# Patient Record
Sex: Male | Born: 1960 | Race: White | Hispanic: No | Marital: Married | Smoking: Never smoker
Health system: Southern US, Community
[De-identification: ages and names within clinical notes are randomized; demographics above are authoritative.]

## PROBLEM LIST (undated history)

## (undated) DIAGNOSIS — I10 Essential (primary) hypertension: Secondary | ICD-10-CM

## (undated) DIAGNOSIS — K219 Gastro-esophageal reflux disease without esophagitis: Secondary | ICD-10-CM

## (undated) DIAGNOSIS — I8390 Asymptomatic varicose veins of unspecified lower extremity: Secondary | ICD-10-CM

## (undated) DIAGNOSIS — K5730 Diverticulosis of large intestine without perforation or abscess without bleeding: Secondary | ICD-10-CM

## (undated) HISTORY — PX: CHOLECYSTECTOMY: SHX55

## (undated) HISTORY — DX: Diverticulosis of large intestine without perforation or abscess without bleeding: K57.30

## (undated) HISTORY — DX: Essential (primary) hypertension: I10

## (undated) HISTORY — PX: COLONOSCOPY: SHX174

## (undated) HISTORY — PX: BASAL CELL CARCINOMA EXCISION: SHX1214

## (undated) HISTORY — DX: Asymptomatic varicose veins of unspecified lower extremity: I83.90

## (undated) HISTORY — PX: VARICOSE VEIN SURGERY: SHX832

## (undated) HISTORY — PX: PILONIDAL CYST EXCISION: SHX744

---

## 2011-01-19 ENCOUNTER — Encounter (INDEPENDENT_AMBULATORY_CARE_PROVIDER_SITE_OTHER): Payer: Self-pay | Admitting: *Deleted

## 2011-01-21 ENCOUNTER — Encounter (INDEPENDENT_AMBULATORY_CARE_PROVIDER_SITE_OTHER): Payer: Self-pay | Admitting: *Deleted

## 2011-01-23 ENCOUNTER — Encounter: Payer: Self-pay | Admitting: Gastroenterology

## 2011-01-29 NOTE — Letter (Signed)
Summary: Moviprep Instructions  [facility]  [address]   [place], [place] [address]   Phone: [phone]  Fax: [phone]       Blake Johnson    11-Jan-1975    MRN: [id number]        Procedure Day Dorna Bloom: Friday, 02-06-11     Arrival Time: 9:00 a.m.     Procedure Time: 10:00 a.m.     Location of Procedure:                    x   [facility] (4th Floor)   PREPARATION FOR COLONOSCOPY WITH MOVIPREP   Starting 5 days prior to your procedure 02-01-11 do not eat nuts, seeds, popcorn, corn, beans, peas,  salads, or any raw vegetables.  Do not take any fiber supplements (e.g. Metamucil, Citrucel, and Benefiber).  THE DAY BEFORE YOUR PROCEDURE         DATE: 02-05-11  DAY: Thursday  1.  Drink clear liquids the entire day-NO SOLID FOOD  2.  Do not drink anything colored red or purple.  Avoid juices with pulp.  No orange juice.  3.  Drink at least 64 oz. (8 glasses) of fluid/clear liquids during the day to prevent dehydration and help the prep work efficiently.  CLEAR LIQUIDS INCLUDE: Water Jello Ice Popsicles Tea (sugar ok, no milk/cream) Powdered fruit flavored drinks Coffee (sugar ok, no milk/cream) Gatorade Juice: apple, white grape, white cranberry  Lemonade Clear bullion, consomm, broth Carbonated beverages (any kind) Strained chicken noodle soup Hard Candy                             4.  In the morning, mix first dose of MoviPrep solution:    Empty 1 Pouch A and 1 Pouch B into the disposable container    Add lukewarm drinking water to the top line of the container. Mix to dissolve    Refrigerate (mixed solution should be used within 24 hrs)  5.  Begin drinking the prep at 5:00 p.m. The MoviPrep container is divided by 4 marks.   Every 15 minutes drink the solution down to the next mark (approximately 8 oz) until the full liter is complete.   6.  Follow completed prep with 16 oz of clear liquid of your choice (Nothing red or purple).   Continue to drink clear liquids until bedtime.  7.  Before going to bed, mix second dose of MoviPrep solution:    Empty 1 Pouch A and 1 Pouch B into the disposable container    Add lukewarm drinking water to the top line of the container. Mix to dissolve    Refrigerate  THE DAY OF YOUR PROCEDURE      DATE: 02-06-11  DAY: Friday  Beginning at 5:00 a.m. (5 hours before procedure):         1. Every 15 minutes, drink the solution down to the next mark (approx 8 oz) until the full liter is complete.  2. Follow completed prep with 16 oz. of clear liquid of your choice.    3. You may drink clear liquids until 8:00 a.m. (2 HOURS BEFORE PROCEDURE).   MEDICATION INSTRUCTIONS  Unless otherwise instructed, you should take regular prescription medications with a small sip of water   as early as possible the morning of your procedure.           OTHER INSTRUCTIONS  You will need a responsible  adult at least 51 years of age to accompany you and drive you home.   This person must remain in the waiting room during your procedure.  Wear loose fitting clothing that is easily removed.  Leave jewelry and other valuables at home.  However, you may wish to bring a book to read or  an iPod/MP3 player to listen to music as you wait for your procedure to start.  Remove all body piercing jewelry and leave at home.  Total time from sign-in until discharge is approximately 2-3 hours.  You should go home directly after your procedure and rest.  You can resume normal activities the  day after your procedure.  The day of your procedure you should not:   Drive   Make legal decisions   Operate machinery   Drink alcohol   Return to work  You will receive specific instructions about eating, activities and medications before you leave.    The above instructions have been reviewed and explained to me by   Ezra Sites RN  January 23, 2011 8:19 AM     I fully understand and can verbalize  these instructions _____________________________ Date _________

## 2011-01-29 NOTE — Letter (Signed)
Summary: Pre Visit Letter Revised  [facility]  [address] [place], [place] [address]   Phone: [phone]  Fax: [phone]        01/19/2011 MRN: [id number] [facility] [address] [place], [place]  [address]             Procedure Date:  02/06/2011 @ 10:00   Direct colon-Dr. Russella Dar   Welcome to the Gastroenterology Division at [facility].    You are scheduled to see a nurse for your pre-procedure visit on 01/23/2011 at 8:00am on the 3rd floor at [facility], [address]  We ask that you try to arrive at our office 15 minutes prior to your appointment time to allow for check-in.  Please take a minute to review the attached form.  If you answer "Yes" to one or more of the questions on the first page, we ask that you call the person listed at your earliest opportunity.  If you answer "No" to all of the questions, please complete the rest of the form and bring it to your appointment.    Your nurse visit will consist of discussing your medical and surgical history, your immediate family medical history, and your medications.   If you are unable to list all of your medications on the form, please bring the medication bottles to your appointment and we will list them.  We will need to be aware of both prescribed and over the counter drugs.  We will need to know exact dosage information as well.    Please be prepared to read and sign documents such as consent forms, a financial agreement, and acknowledgement forms.  If necessary, and with your consent, a friend or relative is welcome to sit-in on the nurse visit with you.  Please bring your insurance card so that we may make a copy of it.  If your insurance requires a referral to see a specialist, please bring your referral form from your primary care physician.  No co-pay is required for this nurse visit.     If you cannot keep your appointment, please call [phone] to cancel or reschedule prior to your  appointment date.  This allows [place] the opportunity to schedule an appointment for another patient in need of care.    Thank you for choosing [facility] for your medical needs.  We appreciate the opportunity to care for you.  Please visit [place] at our website  to learn more about our practice.  Sincerely, The Gastroenterology Division

## 2011-01-29 NOTE — Miscellaneous (Signed)
Summary: [facility] PV  Clinical Lists Changes  Medications: Added new medication of MOVIPREP 100 GM  SOLR (PEG-KCL-NACL-NASULF-NA ASC-C) As per prep instructions. - Signed Rx of MOVIPREP 100 GM  SOLR (PEG-KCL-NACL-NASULF-NA ASC-C) As per prep instructions.;  #1 x 0;  Signed;  Entered by: Ezra Sites RN;  Authorized by: Meryl Dare MD [facility];  Method used: Electron[facility] to [id number] [address] [place], [place]  [address], Ph: [phone] or [phone], Fax: [phone] Observations: Added new observation of NKA: T (01/23/2011 7:57)    Prescriptions: MOVIPREP 100 GM  SOLR (PEG-KCL-NACL-NASULF-NA ASC-C) As per prep instructions.  #1 x 0   Entered by:   Ezra Sites RN   Authorized by:   Meryl Dare MD [facility]   Signed by:   Ezra Sites RN on 01/23/2011   Method used:   [facility]ronically to        [facility] [phone]* (retail)       [address]       [place], [place]  [address]       Ph: [phone] or [phone]       Fax: [phone]   RxID:   [id number]

## 2011-02-06 ENCOUNTER — Other Ambulatory Visit: Payer: Self-pay | Admitting: Gastroenterology

## 2011-02-06 ENCOUNTER — Other Ambulatory Visit (AMBULATORY_SURGERY_CENTER): Payer: 59 | Admitting: Gastroenterology

## 2011-02-06 DIAGNOSIS — K5730 Diverticulosis of large intestine without perforation or abscess without bleeding: Secondary | ICD-10-CM

## 2011-02-06 DIAGNOSIS — Z1211 Encounter for screening for malignant neoplasm of colon: Secondary | ICD-10-CM

## 2011-02-06 DIAGNOSIS — Z800 Family history of malignant neoplasm of digestive organs: Secondary | ICD-10-CM

## 2011-02-06 DIAGNOSIS — D126 Benign neoplasm of colon, unspecified: Secondary | ICD-10-CM

## 2011-02-06 DIAGNOSIS — D1330 Benign neoplasm of unspecified part of small intestine: Secondary | ICD-10-CM

## 2011-02-10 NOTE — Procedures (Addendum)
Summary: Colonoscopy  Patient: Blake Johnson Note: All result statuses are Final unless otherwise noted.  Tests: (1) Colonoscopy (COL)   COL Colonoscopy           DONE     [facility]     [address]     [place], [place]  [address]           COLONOSCOPY PROCEDURE REPORT     PATIENT:  [name], [name]  MR#:  [id number]     BIRTHDATE:  05-15-1961, 49 yrs. old  GENDER:  male     ENDOSCOPIST:  Judie Petit T. Russella Dar, MD, [facility]     Referred by:  Stacie Glaze, M.D.     PROCEDURE DATE:  02/06/2011     PROCEDURE:  Colonoscopy with biopsy and snare polypectomy     ASA CLASS:  Class II     INDICATIONS:  1) Routine Risk Screening: father with colon cancer     at 82.     MEDICATIONS:   Fentanyl 125 mcg IV, Versed 13 mg IV     DESCRIPTION OF PROCEDURE:   After the risks benefits and     alternatives of the procedure were thoroughly explained, informed     consent was obtained.  Digital rectal exam was performed and     revealed no abnormalities.   The LB [id number]-[id number] endoscope     was introduced through the anus and advanced to the cecum, which     was identified by both the appendix and ileocecal valve, without     limitations.  The quality of the prep was excellent, using     MoviPrep.  The instrument was then slowly withdrawn as the colon     was fully examined.     <<PROCEDUREIMAGES>>     FINDINGS:  A sessile polyp was found in the cecum. It was 4 mm in     size. The polyp was removed using cold biopsy forceps.  Two polyps     were found at the hepatic flexure. They were 4 - 5 mm in size.     Polyps were snared without cautery. Retrieval was successful. Mild     diverticulosis was found in the sigmoid colon. Otherwise normal     colonoscopy without other polyps, masses, vascular ectasias, or     inflammatory changes. Retroflexed views in the rectum revealed     internal hemorrhoids, small.  The time to cecum =  2.75  minutes.     The scope was then withdrawn (time =  13.33   min) from the patient     and the procedure completed.           COMPLICATIONS:  None           ENDOSCOPIC IMPRESSION:     1) 4 mm sessile polyp in the cecum     2) 4 - 5 mm Two polyps at the hepatic flexure     3) Mild diverticulosis in the sigmoid colon     4) Internal hemorrhoids           RECOMMENDATIONS:     1) Await pathology results     2) High fiber diet with liberal fluid intake.     3) Repeat Colonscopy in 3 year if all polyps are adenomatous,     colonoscopy in 5 years if 1 or 2 are adenomatous otherwise routine     risk quidelines with colonoscopy in 10 years.Marland Kitchen  Venita Lick. Russella Dar, MD, [facility]           n.     eSIGNED:   Venita Lick. Athira Janowicz at 02/06/2011 10:21 AM           Blake Johnson, [id number]  Note: An exclamation mark (!) indicates a result that was not dispersed into the flowsheet. Document Creation Date: 02/06/2011 10:21 AM _______________________________________________________________________  (1) Order result status: Final Collection or observation date-time: 02/06/2011 10:12 Requested date-time:  Receipt date-time:  Reported date-time:  Referring Physician:   Ordering Physician: Claudette Head [phone]) Specimen Source:  Source: Launa Grill Order Number: [id number] Lab site:   Appended Document: Colonoscopy     Procedures Next Due Date:    Colonoscopy: 01/2016

## 2011-02-11 ENCOUNTER — Encounter: Payer: Self-pay | Admitting: Gastroenterology

## 2011-02-19 NOTE — Letter (Signed)
Summary: Patient Notice- Polyp Results  [facility]  [address] [place], [place] [address]   Phone: [phone]  Fax: [phone]        February 11, 2011 MRN: [id number]    [facility] [address] [place], [place]  [address]    Dear Blake Johnson,  I am pleased to inform you that the colon polyp(s) removed during your recent colonoscopy was (were) found to be benign (no cancer detected) upon pathologic examination.  I recommend you have a repeat colonoscopy examination in 5 years to look for recurrent polyps, as having colon polyps increases your risk for having recurrent polyps or even colon cancer in the future.  Should you develop new or worsening symptoms of abdominal pain, bowel habit changes or bleeding from the rectum or bowels, please schedule an evaluation with either your primary care physician or with me.  Continue treatment plan as outlined the day of your exam.  Please call us if you are having persistent problems or have questions about your condition that have not been fully answered at this time.  Sincerely,  Meryl Dare MD [facility]  This letter has been electronically signed by your physician.  Appended Document: Patient Notice- Polyp Results letter mailed

## 2011-04-10 ENCOUNTER — Observation Stay (HOSPITAL_COMMUNITY)
Admission: EM | Admit: 2011-04-10 | Discharge: 2011-04-10 | Disposition: A | Discharge status: Home / Self Care | Payer: 59 | Attending: Emergency Medicine | Admitting: Emergency Medicine

## 2011-04-10 ENCOUNTER — Observation Stay (HOSPITAL_COMMUNITY): Payer: 59

## 2011-04-10 ENCOUNTER — Emergency Department (HOSPITAL_COMMUNITY): Payer: 59

## 2011-04-10 ENCOUNTER — Encounter (HOSPITAL_COMMUNITY): Payer: Self-pay | Admitting: Radiology

## 2011-04-10 DIAGNOSIS — R079 Chest pain, unspecified: Principal | ICD-10-CM | POA: Insufficient documentation

## 2011-04-10 DIAGNOSIS — R072 Precordial pain: Secondary | ICD-10-CM

## 2011-04-10 DIAGNOSIS — R000 Tachycardia, unspecified: Secondary | ICD-10-CM | POA: Insufficient documentation

## 2011-04-10 HISTORY — DX: Gastro-esophageal reflux disease without esophagitis: K21.9

## 2011-04-10 LAB — TROPONIN I
Troponin I: 0.01 ng/mL (ref 0.00–0.06)
Troponin I: 0.01 ng/mL (ref 0.00–0.06)

## 2011-04-10 LAB — CK TOTAL AND CKMB (NOT AT ARMC)
Relative Index: 1.5 (ref 0.0–2.5)
Relative Index: 1.2 (ref 0.0–2.5)
Total CK: 117 U/L (ref 7–232)
Total CK: 124 U/L (ref 7–232)

## 2011-04-10 LAB — POCT I-STAT, CHEM 8
BUN: 21 mg/dL (ref 6–23)
Calcium, Ion: 1.17 mmol/L (ref 1.12–1.32)
Chloride: 106 mEq/L (ref 96–112)
Glucose, Bld: 108 mg/dL — ABNORMAL HIGH (ref 70–99)
HCT: 40 % (ref 39.0–52.0)

## 2011-04-10 LAB — POCT CARDIAC MARKERS: Troponin i, poc: <0.05 ng/mL (ref 0.00–0.09)

## 2011-04-10 LAB — HEPATIC FUNCTION PANEL
ALT: 20 U/L (ref 0–53)
AST: 20 U/L (ref 0–37)
Bilirubin, Direct: 0.1 mg/dL (ref 0.0–0.3)
Indirect Bilirubin: 0.4 mg/dL (ref 0.3–0.9)
Total Bilirubin: 0.5 mg/dL (ref 0.3–1.2)

## 2011-04-10 MED ORDER — IOHEXOL 350 MG/ML SOLN
100.0000 mL | Freq: Once | INTRAVENOUS | Status: AC | PRN
  Administered 2011-04-10: 100 mL via INTRAVENOUS

## 2011-04-23 ENCOUNTER — Encounter: Payer: Self-pay | Admitting: *Deleted

## 2011-04-23 ENCOUNTER — Encounter: Payer: Self-pay | Admitting: Cardiology

## 2011-04-24 ENCOUNTER — Ambulatory Visit (INDEPENDENT_AMBULATORY_CARE_PROVIDER_SITE_OTHER): Payer: 59 | Admitting: Cardiology

## 2011-04-24 DIAGNOSIS — K219 Gastro-esophageal reflux disease without esophagitis: Secondary | ICD-10-CM

## 2011-04-24 DIAGNOSIS — R079 Chest pain, unspecified: Secondary | ICD-10-CM

## 2011-04-24 DIAGNOSIS — I8390 Asymptomatic varicose veins of unspecified lower extremity: Secondary | ICD-10-CM

## 2011-04-24 NOTE — Patient Instructions (Signed)
Start Apsirin 81mg  daily--this should be enteric coated.  Schedule an appointment with GI for evaluation of GERD/ ?ulcer.  Schedule an appointment with Dr Myra Gianotti for evaluation and management of varicose veins.  You do not need to schedule a follow-up appointment with Dr Shirlee Latch.

## 2011-04-26 ENCOUNTER — Encounter: Payer: Self-pay | Admitting: Cardiology

## 2011-04-26 DIAGNOSIS — I8390 Asymptomatic varicose veins of unspecified lower extremity: Secondary | ICD-10-CM | POA: Insufficient documentation

## 2011-04-26 NOTE — Assessment & Plan Note (Signed)
Atypical chest pain with reassuring stress echo and negative PE CT.  No further chest pain now that he is taking omeprazole daily. He does have significant GERD.  I will have him followup with GI.  He should take ASA 81 mg daily (male > 50 probably benefits).

## 2011-04-26 NOTE — Progress Notes (Signed)
50 yo with history of GERD presents for ER followup of chest pain.  In 4/12, patient felt a pressure/achiness across his chest that lingered all of one day and into the next.  The week prior to this, he had woken up with similar chest pain that resolved in 10-20 minutes.  No exertional chest pain.  After the prolonged episode, he went to the ER.  ECG was unremarkable and cardiac enzymes were negative. D dimer was elevated, so he had PE CT.  This showed no PE or dissection.  He had a stress echo that showed no exercise-induced wall motion abnormalities.  Since coming home from the ER, he has had no further chest pain.  He does remember that he had pushed his lawn tractor for a fairly long distance when it ran out of gas prior to this event and he wonders if the pain could be musculoskeletal.  Patient also has fairly significant GERD.  He has frequent heartburn if he does not take omeprazole, and thinks the pain also could have been a severe version of his prior GERD.  He has been taking omprazole daily since his ER visit.   ECG: NSR, normal  Labs (4/12): D dimer elevated, cardiac enzymes negative, K 4.3, creatinine 1.1  PMH: 1. Varicose veins 2. GERD 3. Atypical chest pain: Stress echo (4/12) with nonspecific upsloping ST depression, exercise to stage IV Bruce protocol, no exercise-induced wall motion abnormalities.   SH: Nonsmoker.  Art gallery manager at [employer].  Married.   SH: Father with colon cancer.  No family history of CAD.   ROS: All systems reviewed and negative except as per HPI.   Current Outpatient Prescriptions  Medication Sig Dispense Refill  . omeprazole (PRILOSEC OTC) 20 MG tablet Take 20 mg by mouth daily.        Marland Kitchen aspirin EC 81 MG tablet Take 1 tablet (81 mg total) by mouth daily.        BP 122/78  Pulse 69  Ht 6\' 2"  (1.88 m)  Wt 263 lb (119.296 kg)  BMI 33.77 kg/m2 General: NAD, overweight Neck: No JVD, no thyromegaly or thyroid nodule.  Lungs: Clear to auscultation  bilaterally with normal respiratory effort. CV: Nondisplaced PMI.  Heart regular S1/S2, no S3/S4, no murmur.  No peripheral edema.  No carotid bruit.  Normal pedal pulses. Significant venous varicosities right lower leg.  Abdomen: Soft, nontender, no hepatosplenomegaly, no distention.  Skin: Intact without lesions or rashes.  Neurologic: Alert and oriented x 3.  Psych: Normal affect. Extremities: No clubbing or cyanosis.  HEENT: Normal.

## 2011-04-26 NOTE — Assessment & Plan Note (Signed)
Very significant venous varicosities in the right leg.  They are somewhat painful.  He wants a referral to a vascular surgeon, which I will provide.

## 2011-04-28 ENCOUNTER — Telehealth: Payer: Self-pay | Admitting: Cardiology

## 2011-04-28 NOTE — Telephone Encounter (Signed)
I reviewed with Dr Shirlee Latch. OK to schedule pt with Dr Hart Rochester or Dr Arbie Cookey.

## 2011-04-28 NOTE — Telephone Encounter (Signed)
I will forward to Dr McLean for review 

## 2011-04-28 NOTE — Telephone Encounter (Signed)
Per  Juliette Alcide, a faxed referral was sent over on yesterday - for pt to see Dr. Myra Gianotti. He particularly doesn't see patients for varicose  vein. Dr. Arbie Cookey & Dr. Hart Rochester are the only physician that see patient for that diagnosed. Will this be O.K.

## 2011-04-29 NOTE — Telephone Encounter (Signed)
I talked with Blake Johnson. She is aware that it is OK to schedule pt with Dr Arbie Cookey or Dr Hart Rochester.

## 2011-05-27 ENCOUNTER — Encounter (INDEPENDENT_AMBULATORY_CARE_PROVIDER_SITE_OTHER): Payer: 59 | Admitting: Vascular Surgery

## 2011-05-27 ENCOUNTER — Encounter (INDEPENDENT_AMBULATORY_CARE_PROVIDER_SITE_OTHER): Payer: 59

## 2011-05-27 DIAGNOSIS — I83893 Varicose veins of bilateral lower extremities with other complications: Secondary | ICD-10-CM

## 2011-05-27 DIAGNOSIS — M79609 Pain in unspecified limb: Secondary | ICD-10-CM

## 2011-05-27 NOTE — Consult Note (Signed)
NEW PATIENT CONSULTATION  Blake Johnson, Blake Johnson DOB:  1961-05-23                                       05/27/2011 EAVWU#:[id number]  Patient presents today for evaluation of right leg venous pathology.  He is a very active, healthy 50 year old gentleman with a 15-year history of progressive varicosities in his right leg.  These have become much more prominent over his medial thigh down onto his calf.  He is having increasingly severe changes of venous stasis disease and venous hypertension with darkening and thickening of the skin around his medial ankle.  He does not have any history of DVT.  PAST MEDICAL HISTORY:  Significant for gastroesophageal reflux disease.  SOCIAL HISTORY:  He does not smoke.  FAMILY HISTORY:  His father with colon cancer.  No history of coronary artery disease in the family.  REVIEW OF SYSTEMS:  Negative except for history of present illness.  PHYSICAL EXAMINATION:  A well-developed and well-nourished white male appearing his stated age in no acute distress.  Blood pressure 126/76, pulse 88, respirations 20.  HEENT:  Normal.  His radial and dorsalis pedis pulses are 2+ bilaterally.  Musculoskeletal shows no major deformities or cyanosis.  Neurologic:  No focal weakness or paresthesias.  Skin without ulcers or rashes.  He does have marked varicosities throughout his medial right leg and matting of a telangiectasia around his foot and thickening of the skin.  He underwent a formal venous duplex in our office, and this shows reflux throughout his great saphenous vein on the right thigh.  He does have some incompetence in his deep system as well.  I discussed this at length with patient due to his extensive changes of venous hypertension.  I have recommend that we will have him fitted with graduated compression stockings, which we have done today, thigh-high, 20 mmHg.  We will see him again in 3 months for continued discussion. With  this severe level of venous hypertension I feel that he may need further treatment with saphenous vein ablation and stab phlebectomy for improvement of his venous hypertension.  We will discuss with him in 3 months.    Larina Earthly, M.D. Electronically Signed  TFE/MEDQ  D:  05/27/2011  T:  05/27/2011  Job:  [id number]  cc:   Marca Ancona, MD

## 2011-06-01 NOTE — Procedures (Unsigned)
LOWER EXTREMITY VENOUS REFLUX EXAM  INDICATION:  Varicose veins and right lower extremity pain.  EXAM:  Using color-flow imaging and pulse Doppler spectral analysis, the right common femoral, femoral, popliteal, posterior tibial, great and small saphenous veins were evaluated.  There is evidence suggesting deep venous insufficiency in the right lower extremity.  The right saphenofemoral junction is not competent with reflux of >585milliseconds. The right GSV is not competent with reflux of >522milliseconds with the caliber as described below.  The right proximal small saphenous vein demonstrates competency.  GSV Diameter (used if found to be incompetent only)                                           Right    Left Proximal Greater Saphenous Vein           0.99 cm  cm Proximal-to-mid-thigh                     0.42 cm  cm Mid thigh                                 0.48 cm  cm Mid-distal thigh                          cm       cm Distal thigh                              0.83 cm  cm Knee                                      0.74 cm  cm  IMPRESSION: 1. The right greater saphenous vein ix not competent with reflux of     >532milliseconds. 2. The right great saphenous vein is not tortuous. 3. The deep venous system of the right lower extremity is not     competent with reflux of >500 milliseconds. 4. The right small saphenous vein is competent. 5. The right mid/distal calf perforator is incompetent with reflux of     >500 milliseconds and measures approximately 0.35 cm.     ___________________________________________ Larina Earthly, M.D.  SH/MEDQ  D:  05/27/2011  T:  05/27/2011  Job:  [id number]

## 2011-06-16 ENCOUNTER — Telehealth: Payer: Self-pay | Admitting: Gastroenterology

## 2011-06-16 NOTE — Telephone Encounter (Signed)
Patient has had several incidents over a few months of chest pain.  He has been cleared by Cardiology.  He is asking to be seen prior to August.  Prilosec qd is not helping.  I have reviewed with him an antireflux diet and asked him to increase his Prilosec to BID.  He is given an appt with Mike Gip PA for 06/19/11 11:00

## 2011-06-19 ENCOUNTER — Encounter: Payer: Self-pay | Admitting: Physician Assistant

## 2011-06-19 ENCOUNTER — Telehealth: Payer: Self-pay | Admitting: *Deleted

## 2011-06-19 ENCOUNTER — Other Ambulatory Visit (INDEPENDENT_AMBULATORY_CARE_PROVIDER_SITE_OTHER): Payer: 59

## 2011-06-19 ENCOUNTER — Ambulatory Visit (INDEPENDENT_AMBULATORY_CARE_PROVIDER_SITE_OTHER): Payer: 59 | Admitting: Physician Assistant

## 2011-06-19 VITALS — BP 118/84 | HR 88 | Ht 74.0 in | Wt 261.0 lb

## 2011-06-19 DIAGNOSIS — K219 Gastro-esophageal reflux disease without esophagitis: Secondary | ICD-10-CM

## 2011-06-19 DIAGNOSIS — R143 Flatulence: Secondary | ICD-10-CM

## 2011-06-19 DIAGNOSIS — R1013 Epigastric pain: Secondary | ICD-10-CM

## 2011-06-19 DIAGNOSIS — R079 Chest pain, unspecified: Secondary | ICD-10-CM

## 2011-06-19 DIAGNOSIS — R141 Gas pain: Secondary | ICD-10-CM

## 2011-06-19 LAB — HEPATIC FUNCTION PANEL
ALT: 20 U/L (ref 0–53)
Bilirubin, Direct: 0.1 mg/dL (ref 0.0–0.3)
Total Bilirubin: 0.3 mg/dL (ref 0.3–1.2)

## 2011-06-19 LAB — CBC WITH DIFFERENTIAL/PLATELET
Basophils Absolute: 0.1 10*3/uL (ref 0.0–0.1)
Eosinophils Absolute: 0.2 10*3/uL (ref 0.0–0.7)
HCT: 38.2 % — ABNORMAL LOW (ref 39.0–52.0)
Lymphs Abs: 2.1 10*3/uL (ref 0.7–4.0)
MCHC: 34.6 g/dL (ref 30.0–36.0)
MCV: 86.2 fl (ref 78.0–100.0)
Monocytes Absolute: 0.6 10*3/uL (ref 0.1–1.0)
Neutrophils Relative %: 70.9 % (ref 43.0–77.0)
Platelets: 308 10*3/uL (ref 150.0–400.0)
RDW: 13.3 % (ref 11.5–14.6)
WBC: 10.3 10*3/uL (ref 4.5–10.5)

## 2011-06-19 NOTE — Patient Instructions (Signed)
Please go to the basement level to have your labs drawn.  Take the Prilosec twice daily. Stay on a bland , low fat diet for now.  We have scheduled the Abdominal Ultrasound at [facility]. Date and directions provided.

## 2011-06-19 NOTE — Telephone Encounter (Signed)
Message copied by Daphine Deutscher on Fri Jun 19, 2011  4:36 PM ------      Message from: Mike Gip S      Created: Fri Jun 19, 2011  4:34 PM       Please let Blake Johnson know his labs are normal. He is scheduled for an ultrasound

## 2011-06-19 NOTE — Progress Notes (Signed)
Agree with initial assessment and plans 

## 2011-06-19 NOTE — Telephone Encounter (Signed)
Left a message for patient to call me. 

## 2011-06-19 NOTE — Progress Notes (Signed)
Subjective:    Patient ID: Blake Johnson, male    DOB: December 04, 1961, 50 y.o.   MRN: [id number]  HPI Blake Johnson is a pleasant 50 year old white male known to Dr. Shana Chute underwent colonoscopy in February 2012 with finding of one polyp which was a tubular adenoma and diverticulosis. Patient has been self treating for acid reflux over the past one year with Prilosec. He says he takes it about every other day generally.  At this time he had onset about a month and a half ago of intermittent episodes of upper abdominal bloating chest discomfort and pain between his shoulder blades. He says these episodes have awakened him at night and may last for up to 3 days and then gradually resolved. He has not had any associated fever or chills no nausea or vomiting no change in his bowel habits no melena or hematochezia. He had an episode in April which took him to the emergency room and has since undergone a cardiac workup with Dr. Shirlee Latch. He has had an unremarkable EKG negative cardiac enzymes. D. dimer was elevated so he had to CT scan of the chest which was negative for PE. He has also had a stress echo which was normal. Patient reports 3 distinct episodes since that time the last one over this past weekend. He says is very uncomfortable that he generally doesn't eat as much during these episodes. He does feel he has some ongoing acid reflux has no dysphagia or odynophagia and no typical heartburn-type symptoms. He uses an occasional law ibuprofen but none on regular basis.  Patient has not had prior upper endoscopy.  Labs done in April 2012 with CBC and hepatic panel unremarkable. At this time he is taking Prilosec twice daily just over the past few days.    Review of Systems  Constitutional: Negative.   HENT: Negative.   Eyes: Negative.   Respiratory: Negative.   Cardiovascular: Positive for chest pain.  Gastrointestinal: Positive for abdominal pain and abdominal distention.  Genitourinary: Negative.     Musculoskeletal: Negative.   Skin: Negative.   Neurological: Negative.   Hematological: Negative.   Psychiatric/Behavioral: Negative.       Outpatient Prescriptions Prior to Visit  Medication Sig Dispense Refill  . omeprazole (PRILOSEC OTC) 20 MG tablet Take 20 mg by mouth daily.        Marland Kitchen aspirin EC 81 MG tablet Take 1 tablet (81 mg total) by mouth daily.         Objective:   Physical Exam Well-developed white male in no acute distress, pleasant, alert and oriented x3 HEENT;  normocephalic EOMI PERRLA sclera anicteric  Neck; supple no JVD  Cardiovascular; regular rate and rhythm with S1-S2  Pulmonary; clear bilaterally Abdomen; soft basically nontender nondistended bowel sounds active no mass or hepatosplenomegaly  Rectal; not done Skin; warm and dry, no jaundice, no edema  Psych; mood and affect normal and appropriate        Assessment & Plan:  #5 50 year old male with episodic chest pain, upper abdominal bloating and upper back pain radiating between his shoulder blades. This is in the setting of recent negative cardiac workup, and chronic GERD. Am not convinced that his current symptoms are secondary to acid reflux, need to rule out biliary colic.  Plan; Continue Prilosec 20 mg by mouth twice daily Bland low-fat diet. CBC and hepatic panel today Schedule for upper abdominal ultrasound, if this is negative, will need upper endoscopy with Dr. Russella Dar. Patient is also asked to call  should he have another episode of the labs can be obtained while he is symptomatic  #2 Diverticulosis  #3 Adenomatous colon polyps Last colonoscopy February 2012.

## 2011-06-22 NOTE — Telephone Encounter (Signed)
Patient notified of lab results as per Amy Esterwood, PA 

## 2011-06-25 ENCOUNTER — Ambulatory Visit (HOSPITAL_COMMUNITY)
Admission: RE | Admit: 2011-06-25 | Discharge: 2011-06-25 | Disposition: A | Discharge status: Home / Self Care | Payer: 59 | Source: Ambulatory Visit | Attending: Physician Assistant | Admitting: Physician Assistant

## 2011-06-25 ENCOUNTER — Telehealth: Payer: Self-pay

## 2011-06-25 DIAGNOSIS — R142 Eructation: Secondary | ICD-10-CM | POA: Insufficient documentation

## 2011-06-25 DIAGNOSIS — R143 Flatulence: Secondary | ICD-10-CM | POA: Insufficient documentation

## 2011-06-25 DIAGNOSIS — R1013 Epigastric pain: Secondary | ICD-10-CM | POA: Insufficient documentation

## 2011-06-25 DIAGNOSIS — R079 Chest pain, unspecified: Secondary | ICD-10-CM | POA: Insufficient documentation

## 2011-06-25 DIAGNOSIS — R1011 Right upper quadrant pain: Secondary | ICD-10-CM

## 2011-06-25 DIAGNOSIS — R141 Gas pain: Secondary | ICD-10-CM

## 2011-06-25 DIAGNOSIS — K8020 Calculus of gallbladder without cholecystitis without obstruction: Secondary | ICD-10-CM

## 2011-06-25 NOTE — Telephone Encounter (Signed)
Message copied by Annett Fabian on Thu Jun 25, 2011  3:35 PM ------      Message from: [place], [place] S      Created: Thu Jun 25, 2011  2:41 PM       Please call pt and let him know the US shows multiple gallstones which are probably causing his sxs, see how he is feeling. I will review with Dr. Russella Dar- but  He needs surgical consult, please go ahead and set him up , thanks

## 2011-06-26 ENCOUNTER — Telehealth: Payer: Self-pay | Admitting: *Deleted

## 2011-06-26 DIAGNOSIS — K8020 Calculus of gallbladder without cholecystitis without obstruction: Secondary | ICD-10-CM

## 2011-06-26 DIAGNOSIS — R935 Abnormal findings on diagnostic imaging of other abdominal regions, including retroperitoneum: Secondary | ICD-10-CM

## 2011-06-26 NOTE — Telephone Encounter (Signed)
Called pt and LM , [facility] made him an appt with Dr. Consuello Bossier on 7-24-112 at 1:30 PM.  He is to arrive at 1:00 PM.  I also left the phone number for [facility] and their address.  I advised he needs to call them if he cannot make that appointment to reschedule.

## 2011-06-26 NOTE — Telephone Encounter (Signed)
Patient was contacted by [facility].  Referral scheduled for 07/07/11 1:30

## 2011-06-26 NOTE — Telephone Encounter (Signed)
Message copied by Derry Skill on Fri Jun 26, 2011  8:30 AM ------      Message from: Claudette Head T      Created: Thu Jun 25, 2011  5:44 PM       Please schedule surgical consult for cholelithiaisis

## 2011-06-26 NOTE — Telephone Encounter (Signed)
I called the pt @ [phone] and advised him of the ultrasound results with the gallstones.  I advised him that Dr Russella Dar and Mike Gip PA reviewed the [place] results and he has gallstones, more than likely the reason for his pain.  I LM we will contact [facility] and make an appointment for him to have a consultation with a surgeon.  I also advised him [facility] has access to his records .  I left my name and number for him to call me.

## 2011-07-07 ENCOUNTER — Ambulatory Visit (INDEPENDENT_AMBULATORY_CARE_PROVIDER_SITE_OTHER): Payer: 59 | Admitting: General Surgery

## 2011-07-07 ENCOUNTER — Encounter (INDEPENDENT_AMBULATORY_CARE_PROVIDER_SITE_OTHER): Payer: Self-pay | Admitting: General Surgery

## 2011-07-07 VITALS — BP 120/78 | HR 80 | Temp 97.0°F | Ht 74.0 in | Wt 258.0 lb

## 2011-07-07 DIAGNOSIS — K8020 Calculus of gallbladder without cholecystitis without obstruction: Secondary | ICD-10-CM

## 2011-07-07 NOTE — Progress Notes (Signed)
Subjective:     Patient ID: Blake Johnson, male   DOB: 1961-03-12, 50 y.o.   MRN: [id number]  HPI  Review of SystemsROS No chronic medical problems no chronic illnesses. No allergies. 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 Patient is married and has 2 children    Objective:   Physical ExamVital signs are reviewed and are normal his lungs were clear cardiac normal sinus rhythm slightly increased weight no evidence of any abdominal tenderness on exam at this time no umbilical or inguinal hernias noted I did not do her colon or rectal exam disease had a recent colonoscopy by Dr. Russella Dar denies any chronic back problems.    Assessment:    Patient's ultrasound and history was reviewed also reviewed little gallbladder booklet with him and he admitted that his worst episode of pain started approximately 3 AM after he had a heavy meal late that evening and night before in the ER I do not think his f his were abnormal and on the ultrasound we don't see a dilated common bile    Plan:    We will plan on doing the appendectomy cholecystectomy and cholangiogram at [facility] in the next few weeks think he's got a trip scheduled to [place] thank you was to postpone this and have his gallbladder surgery prior to the trip he will be off work only a few days hopefully I would not plan on going out of the country for approximately a week after surgery.  I did give him a prescription for Vicodin if he would have an episode of pain he can take a pain tablet otherwise he'll use the tablets for postoperative pain management

## 2011-07-07 NOTE — Patient Instructions (Signed)
To talk with schedule as about arranging for your surgery. Follow a small incision low-fat low bulk diet and not evening going to bed until he is her gallbladder removed. We will plan on allowing you to be released on the day of surgery that if indeed we can keep her overnight in the following morning. We will plan on doing a cholangiogram at the time of  cholecystectomy

## 2011-07-14 ENCOUNTER — Telehealth: Payer: Self-pay | Admitting: Cardiology

## 2011-07-14 NOTE — Telephone Encounter (Signed)
LOV,12 Lead faxed to [facility]  @ [phone]  07/14/11/km

## 2011-07-16 ENCOUNTER — Other Ambulatory Visit (INDEPENDENT_AMBULATORY_CARE_PROVIDER_SITE_OTHER): Payer: Self-pay | Admitting: General Surgery

## 2011-07-16 ENCOUNTER — Encounter (HOSPITAL_COMMUNITY): Payer: 59

## 2011-07-16 LAB — DIFFERENTIAL
Eosinophils Relative: 2 % (ref 0–5)
Lymphocytes Relative: 22 % (ref 12–46)
Lymphs Abs: 2.1 10*3/uL (ref 0.7–4.0)
Monocytes Relative: 6 % (ref 3–12)

## 2011-07-16 LAB — COMPREHENSIVE METABOLIC PANEL
AST: 13 U/L (ref 0–37)
Albumin: 3.7 g/dL (ref 3.5–5.2)
Alkaline Phosphatase: 112 U/L (ref 39–117)
BUN: 12 mg/dL (ref 6–23)
CO2: 28 mEq/L (ref 19–32)
Chloride: 103 mEq/L (ref 96–112)
GFR calc non Af Amer: >60 mL/min (ref >60)
Potassium: 3.5 mEq/L (ref 3.5–5.1)
Total Bilirubin: 0.3 mg/dL (ref 0.3–1.2)

## 2011-07-16 LAB — CBC
HCT: 40.7 % (ref 39.0–52.0)
Hemoglobin: 13 g/dL (ref 13.0–17.0)
MCH: 27.9 pg (ref 26.0–34.0)
MCV: 87.3 fL (ref 78.0–100.0)
Platelets: 278 10*3/uL (ref 150–400)
RBC: 4.66 MIL/uL (ref 4.22–5.81)
WBC: 9.6 10*3/uL (ref 4.0–10.5)

## 2011-07-16 LAB — SURGICAL PCR SCREEN: MRSA, PCR: NEGATIVE

## 2011-07-22 ENCOUNTER — Other Ambulatory Visit (INDEPENDENT_AMBULATORY_CARE_PROVIDER_SITE_OTHER): Payer: Self-pay | Admitting: General Surgery

## 2011-07-22 ENCOUNTER — Ambulatory Visit (HOSPITAL_COMMUNITY)
Admission: RE | Admit: 2011-07-22 | Discharge: 2011-07-22 | Disposition: A | Discharge status: Home / Self Care | Payer: 59 | Source: Ambulatory Visit | Attending: General Surgery | Admitting: General Surgery

## 2011-07-22 ENCOUNTER — Ambulatory Visit (HOSPITAL_COMMUNITY): Payer: 59

## 2011-07-22 DIAGNOSIS — K8000 Calculus of gallbladder with acute cholecystitis without obstruction: Secondary | ICD-10-CM | POA: Insufficient documentation

## 2011-07-22 DIAGNOSIS — K8010 Calculus of gallbladder with chronic cholecystitis without obstruction: Secondary | ICD-10-CM

## 2011-07-22 DIAGNOSIS — E663 Overweight: Secondary | ICD-10-CM | POA: Insufficient documentation

## 2011-07-22 DIAGNOSIS — Z01812 Encounter for preprocedural laboratory examination: Secondary | ICD-10-CM | POA: Insufficient documentation

## 2011-07-22 DIAGNOSIS — Z6833 Body mass index (BMI) 33.0-33.9, adult: Secondary | ICD-10-CM | POA: Insufficient documentation

## 2011-07-29 NOTE — Op Note (Signed)
NAME:  Blake Johnson, Blake Johnson NO.:  [id number]  MEDICAL RECORD NO.:  [id number]  LOCATION:  DAYL                         FACILITY:  [facility]  PHYSICIAN:  Anselm Pancoast. Taneesha Edgin, M.D.DATE OF BIRTH:  Aug 31, 1961  DATE OF PROCEDURE:  07/22/2011 DATE OF DISCHARGE:  07/22/2011                              OPERATIVE REPORT   PREOPERATIVE DIAGNOSIS:  Chronic cholecystitis with stones.  POSTOPERATIVE DIAGNOSIS:  Acute cholecystitis with stones.  OPERATIONS:  Laparoscopic cholecystectomy with cholangiogram.  ANESTHESIA:  General anesthesia.  SURGEON:  Anselm Pancoast. Zachery Dakins, M.D.  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  An ultrasound was performed and that shows chronic cholecystitis within the gallbladder, largest about a 1.5 in size and no gallbladder wall thickening or pericystic fluid on the ultrasound that was done on June 25, 2011.  The patient travels to [place] frequently and I saw him in the office on July 07, 2011, and at that time he was not having acute pain.  He had a trip scheduled to [place] coming up soon, but he elected to postpone that and wanted to proceed on with removing his gallbladder.  The patient has a colonoscopy with Dr. Russella Dar within the last several years and when I saw him in the office he was not acutely tender.  He says he has not had further episodes of acute pain in the 10 days since I saw him in the office for 2 weeks and is here for this planned procedure.  His lab studies shows that his white count was 9600.  His hematocrit is 40.  His liver function studies are normal.  Glucose was 122.  The patient preoperatively was given a dose of antibiotics.  He has PAS stockings and after the permits have been signed, etc., was  induced with general anesthesia and endotracheal tube placed by Dr. __________ and then the abdomen was prepped with Betadine solution and draped in sterile manner.  I made a little small vertical incision below the umbilicus.  The patient is about 230 pounds and the fascia was identified, picked up between two Kochers and then a small opening carefully made through the fascia and then the underlying peritoneum.  Pursestring suture of 0 Vicryl was placed and Hasson cannula introduced.  The gallbladder was visualized but the omentum was adherent, acute and chronic inflammation and the gallbladder was so tense that you could not grasp it and after the trocars had been placed a 10 mm subxiphoid and two lateral 5 mm trocars.  The fascia had been anesthetized for trocar placement.  I aspirated the gallbladder with __________ aspirator and I had peeled off the omentum from it.  Then you could grasp the gallbladder and retracted outward lateral and the adhesions were fortunately were kind of more acute so they could be kind of teased away and it was not necessary to use a 30 degree camera.  He was rotated to the left and his head was elevated and then the proximal portion of the  gallbladder there was a big stone impacted in the proximal portion of the gallbladder and we carefully teased this freeing up the gallbladder.  I could see the cystic duct lymph node and the stone that was packed in the neck of the gallbladder prevented [place] from being able to do anything very rapidly but then we could kind of milk the stone back up into the fundus of the gallbladder.  With this, you could then see what looked like a very short cystic duct and whether we would talk about the common hepatic duct or what we were not positive to be sure, you could see the cystic artery and I doubly clipped it, but did not divide it and then I put a clip across what I thought was the most distal portion of the gallbladder and  cystic duct and made a little opening.  The mucosa of the cystic bile duct was identified.  This was opened and a Cook catheter introduced.  The cholangiogram shows that the gallbladder attaches to the right hepatic duct and this was about a centimeter duct and I did the cholangiogram.  The left and right hepatic ducts was visualized but then we obliqued the x-ray and did a second one to be sure that was what we were seeing and it confirmed our original suspicion.  The catheter was removed.  I could get two clips on the cystic duct and I do not think I have compromised the little right hepatic branch and then after this was clipped and divided, I then inspected the cystic artery again and put another clip on the gallbladder and divided it and then freed this inflamed gallbladder from the liver bed.  Good hemostasis was obtained.  The gallbladder after it was completely freed was placed in an EndoCatch bag.  At the little area where I had aspirated the gallbladder at the end, there was a little bit of bile, it was white bile spillage and we placed a gallbladder in the EndoCatch bag and we washed everything thoroughly.  The gallbladder bed was inspected.  Hemostasis was good.  I used __________ to kind of cauterized a few little questionable areas but nothing of any significant bleeding that I could see.  I then switched the 10 mm camera to the upper 10 mm trocar, grabbed the bag containing the gallbladder. I did open the gallbladder, pulling the neck up in the bag, and then got the big stone out so I could bring it out through the fascia incision without enlarging it.  The fascia was then closed with an additional figure-of-8 of 0 Vicryl in addition to the pursestring tied on both and anesthetized the fascia and then I put one single stitch in the middle because we could still get the tip of a hemostat in and he is a large individual.  The reinspection, we had looked at the clips, the  artery and everything looked satisfactory and irrigating fluid aspirated.  Then we removed the 5 mm port and released CO2 and then withdrew the upper 10 mm trocar.  The subcutaneous wounds were closed with 4-0 Vicryl. Benzoin and Steri-Strips on the skin.  Sponge and needle counts were correct x 2.     Anselm Pancoast. Zachery Dakins, M.D.     WJW/MEDQ  D:  07/22/2011  T:  07/23/2011  Job:  [id number]  cc:   Venita Lick. Russella Dar, MD, FACG [address] [place] [address]  Electronically Signed by Consuello Bossier M.D. on 07/29/2011 09:21:53  AM

## 2011-07-30 ENCOUNTER — Encounter: Payer: Self-pay | Admitting: *Deleted

## 2011-08-20 ENCOUNTER — Encounter: Payer: Self-pay | Admitting: Vascular Surgery

## 2011-08-26 ENCOUNTER — Ambulatory Visit: Payer: 59 | Admitting: Vascular Surgery

## 2011-09-02 ENCOUNTER — Encounter (INDEPENDENT_AMBULATORY_CARE_PROVIDER_SITE_OTHER): Payer: Self-pay | Admitting: General Surgery

## 2011-09-02 ENCOUNTER — Ambulatory Visit (INDEPENDENT_AMBULATORY_CARE_PROVIDER_SITE_OTHER): Payer: 59 | Admitting: General Surgery

## 2011-09-02 VITALS — BP 136/94 | HR 60 | Temp 98.6°F | Resp 16 | Ht 74.0 in | Wt 216.4 lb

## 2011-09-02 DIAGNOSIS — K8010 Calculus of gallbladder with chronic cholecystitis without obstruction: Secondary | ICD-10-CM

## 2011-09-02 NOTE — Patient Instructions (Signed)
See [place] on a p.r.n. basis I would not recommend he is going to with a history of esophageal reflux

## 2011-09-02 NOTE — Progress Notes (Addendum)
Subjective:     Patient ID: Blake Johnson, male   DOB: Oct 19, 1961, 50 y.o.   MRN: [id number]  HPIPatient returns he is an approximate 6 weeks followup [place] cholecystectomy cholangiogram and is doing fine his incision is healed nicely she's had a history of reflux esophagitis but he says is really not needed to Prilosec he's been on for years as the episodes of fever follow esophageal reflux were probably related to a little gallbladder problem he is back to work no restrictions in his incisions look good   Review of Systems     Objective:   Physical ExamSatisfactory postoperative course of larroscopic cholecystectomy and cholangiogram stones in his gallbladder on path exam. He has decrease symptoms that contributed to his GERD following his cholecystectomy, he can taper down on his antacids and will see Dr. Russella Dar OR  or [place] p.r.n. basis.    Assessment:         Plan:

## 2011-10-19 ENCOUNTER — Encounter: Payer: Self-pay | Admitting: Vascular Surgery

## 2011-10-20 ENCOUNTER — Encounter: Payer: Self-pay | Admitting: Vascular Surgery

## 2011-10-20 ENCOUNTER — Ambulatory Visit (INDEPENDENT_AMBULATORY_CARE_PROVIDER_SITE_OTHER): Payer: 59 | Admitting: Vascular Surgery

## 2011-10-20 VITALS — BP 135/89 | HR 88 | Resp 16 | Ht 74.0 in | Wt 264.5 lb

## 2011-10-20 DIAGNOSIS — I83893 Varicose veins of bilateral lower extremities with other complications: Secondary | ICD-10-CM

## 2011-10-20 NOTE — Progress Notes (Signed)
Problems with Activities of Daily Living Secondary to Leg Pain  1. Mr. Orrego states leg pain makes plane trips (his job requires frequent international travel) and car trips very difficult for him.   2. Mr. Laurel states that leg pain makes yard work very difficult for him.  3. Mr. Axtman states that leg pain makes walking on treadmill for exercise very difficult for him.   Rankin, Neena Rhymes    Failure of  Conservative Therapy:  1. Worn 20-30 mm Hg thigh high compression hose >3 months with no relief of symptoms.  2. Frequently elevates legs-no relief of symptoms  3. Taken Ibuprofen 600 Mg TID with no relief of symptoms.  The patient continues to have temperature in his right calf and thigh despite graduated compression garments. He underwent repeat imaging with SonoSite by myself. This shows enlargement and reflux throughout his great saphenous vein. He has exit of his main saphenous vein in the distal thigh and a large branch that runs under the skin and then reenters his main saphenous vein in the proximal thigh. He may require 2 separate laser treatments of the areas involved. I have recommended laser ablation and stab phlebectomy for relief of his venous hypertension. He does have extensive hemosiderin deposits in his medial ankle from his venous hypertension.

## 2011-11-03 ENCOUNTER — Other Ambulatory Visit: Payer: Self-pay | Admitting: *Deleted

## 2011-11-03 DIAGNOSIS — I83893 Varicose veins of bilateral lower extremities with other complications: Secondary | ICD-10-CM

## 2011-11-18 ENCOUNTER — Encounter: Payer: Self-pay | Admitting: Vascular Surgery

## 2011-11-18 ENCOUNTER — Other Ambulatory Visit: Payer: 59 | Admitting: Vascular Surgery

## 2011-11-19 ENCOUNTER — Ambulatory Visit (INDEPENDENT_AMBULATORY_CARE_PROVIDER_SITE_OTHER): Payer: 59 | Admitting: Vascular Surgery

## 2011-11-19 ENCOUNTER — Encounter: Payer: Self-pay | Admitting: Vascular Surgery

## 2011-11-19 VITALS — BP 130/83 | HR 92 | Resp 20 | Ht 74.0 in | Wt 261.0 lb

## 2011-11-19 DIAGNOSIS — I83893 Varicose veins of bilateral lower extremities with other complications: Secondary | ICD-10-CM

## 2011-11-19 NOTE — Progress Notes (Signed)
Laser Ablation Procedure      Date: 11/19/2011    Raijon Lindfors DOB:[birth year]  Consent signed: Yes  Surgeon:T.F. Jawana Reagor  Procedure: Laser Ablation: right Greater Saphenous Vein  BP 130/83  Pulse 92  Resp 20  Ht 6\' 2"  (1.88 m)  Wt 261 lb (118.389 kg)  BMI 33.51 kg/m2  Start time: 3 :05 PM   End time: 4 :40PM  Tumescent Anesthesia: 650 cc 0.9% NaCl with 50 cc Lidocaine HCL with 1% Epi and 15 cc 8.4% NaHCO3  Local Anesthesia: 4 cc Lidocaine HCL and NaHCO3 (ratio 2:1)  Continuous Mode: 15 Watts Total Energy 2378  Joules Total Time2 :38     Stab Phlebectomy: 10-20 Sites: Thigh and Calf  Patient tolerated procedure well: Yes  Rankin, Neena Rhymes  Description of Procedure:  After marking the course of the saphenous vein and the secondary varicosities in the standing position, the patient was placed on the operating table in the supine position, and the right leg was prepped and draped in sterile fashion. Local anesthetic was administered, and under ultrasound guidance the saphenous vein was accessed with a micro needle and guide wire; then the micro puncture sheath was placed. A guide wire was inserted to the saphenofemoral junction, followed by a 5 french sheath.  The position of the sheath and then the laser fiber below the junction was confirmed using the ultrasound and visualization of the aiming beam.  Tumescent anesthesia was administered along the course of the saphenous vein using ultrasound guidance. Protective laser glasses were placed on the patient, and the laser was fired at at 15 watt continuous mode.  For a total of 2378 joules.  A steri strip was applied to the puncture site.  The patient was then put into Trendelenburg position.  Local anesthetic was utilized overlying the marked varicosities.  Greater than 10-20 stab wounds were made using the tip of an 11 blade; and using the vein hook,  The phlebectomies were performed using a hemostat to avulse these  varicosities.  Adequate hemostasis was achieved, and steri strips were applied to the stab wound.      ABD pads and thigh high compression stockings were applied.  Ace wrap bandages were applied over the phlebectomy sites and at the top of the saphenofemoral junction.  Blood loss was less than 15 cc.  The patient ambulated out of the operating room having tolerated the procedure well.  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

## 2011-11-20 ENCOUNTER — Encounter: Payer: Self-pay | Admitting: Vascular Surgery

## 2011-11-24 ENCOUNTER — Telehealth: Payer: Self-pay | Admitting: *Deleted

## 2011-11-24 NOTE — Telephone Encounter (Signed)
11/24/2011  Time: 8:54 AM   Patient Name: Blake Johnson  Patient of: T.F. Early  Procedure:Laser Ablation right    Stab phlebectomy 10-20 right leg  Left a message at patient's jhome checking  His status  Yes    Comments/Actions Taken: Requested [name] call [facility] if experiencing problems or if he has questions.      @SIGNATURE @Keondrick Dilks , Neena Rhymes

## 2011-11-25 ENCOUNTER — Encounter: Payer: Self-pay | Admitting: Vascular Surgery

## 2011-11-26 ENCOUNTER — Encounter: Payer: Self-pay | Admitting: Vascular Surgery

## 2011-11-26 ENCOUNTER — Ambulatory Visit (INDEPENDENT_AMBULATORY_CARE_PROVIDER_SITE_OTHER): Payer: 59 | Admitting: Vascular Surgery

## 2011-11-26 VITALS — BP 115/79 | HR 86 | Resp 20 | Ht 74.0 in | Wt 262.0 lb

## 2011-11-26 DIAGNOSIS — I83893 Varicose veins of bilateral lower extremities with other complications: Secondary | ICD-10-CM

## 2011-11-26 NOTE — Progress Notes (Signed)
Patient presents today for followup of his laser ablation of right great saphenous vein one week ago. He had phlebectomy of extensive varicosities in his medial thigh and calf as well. He has done well with the usual amount of bruising and swelling. He has been compliant with his compression garment. His incisions are all healing nicely.  Venous duplex today reveals a closure of the saphenous vein from the entry site to just below the saphenofemoral junction no evidence of DVT.  He will continue to wear his compression garment and we will see for additional week and we will see him again on an as-needed basis

## 2011-11-26 NOTE — Progress Notes (Signed)
Rle venous duplex f/u post ablation from 11/19/2011 performed @ [facility]

## 2011-12-10 NOTE — Procedures (Unsigned)
DUPLEX DEEP VENOUS EXAM - LOWER EXTREMITY  INDICATION:  Varicose veins, pain and swelling of the right lower extremity.  HISTORY:  Edema:  Yes. Trauma/Surgery:  Right endovenous laser ablation on 11/19/2011. Pain:  Yes. PE:  No. Previous DVT:  No. Anticoagulants:  No. Other:  DUPLEX EXAM:               CFV   SFV   PopV  PTV    GSV               R  L  R  L  R  L  R   L  R  L Thrombosis    o  o  o     o     o      + Spontaneous   +  +  +     +     +      0 Phasic        +  +  +     +     +      0 Augmentation  +  +  +     +     +      0 Compressible  +  +  +     +     +      0 Competent     +  +  +     0     +  Legend:  + - yes  o - no  p - partial  D - decreased  IMPRESSION: 1. No evidence of deep venous thrombosis identified in the right lower     extremity. 2. Good post-ablation results the length of the right greater     saphenous vein ablated. 3. Contralateral common femoral vein is patent and compressible.   _____________________________ Larina Earthly, M.D.  SH/MEDQ  D:  11/26/2011  T:  11/26/2011  Job:  [id number]

## 2012-12-30 IMAGING — US US ABDOMEN COMPLETE
1 series · 13 of 25 positions shown · non-contrast
Comparison: None

CLINICAL DATA: Epigastric pain.  Bloating.  Chest pain.

ABDOMINAL ULTRASOUND COMPLETE

[Series 1: us abdomen complete · 0.35mm/px · 13 of 76 slices shown]
[im 1/76]
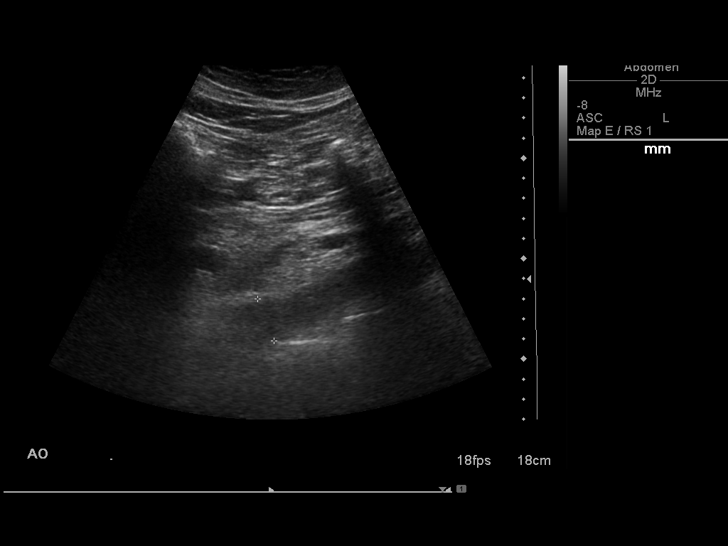
[im 7/76]
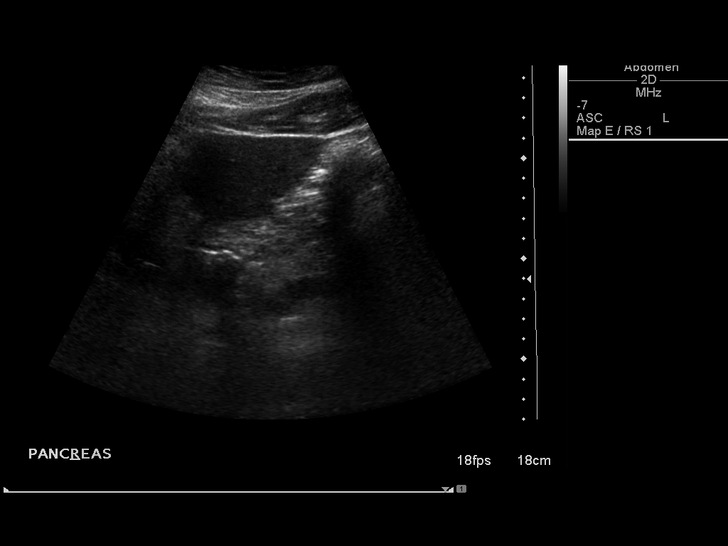
[im 13/76]
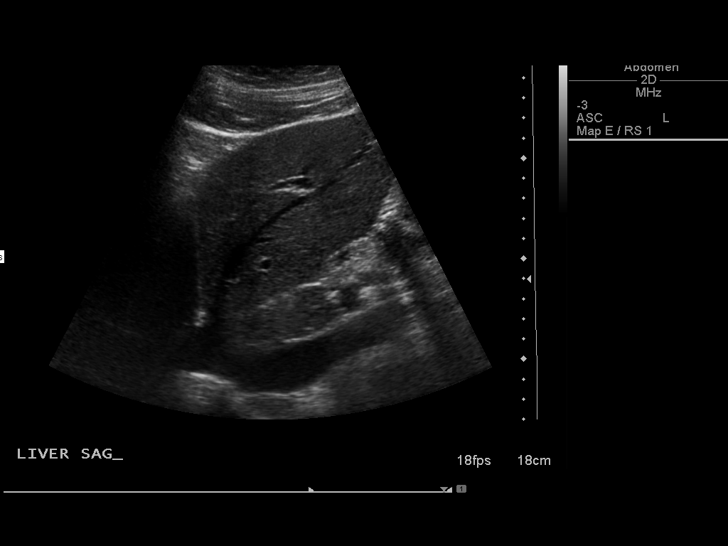
[im 19/76]
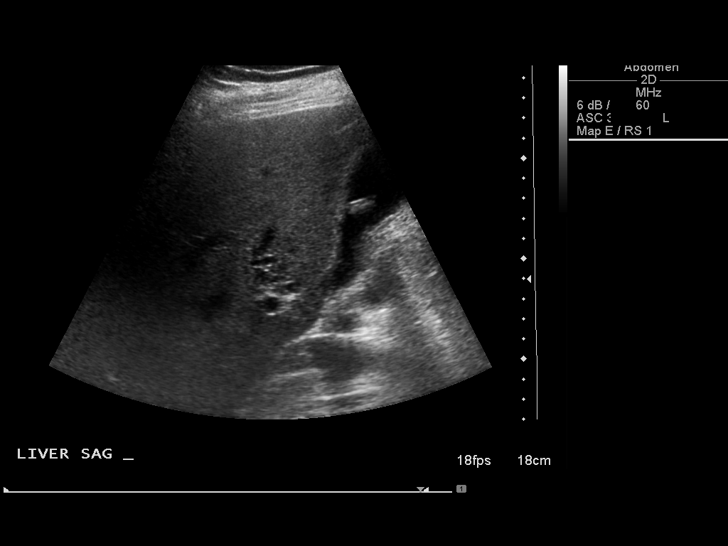
[im 26/76]
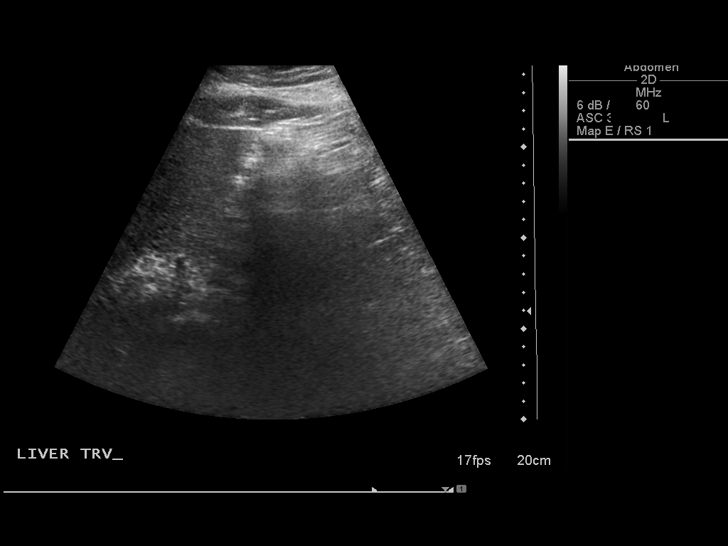
[im 32/76]
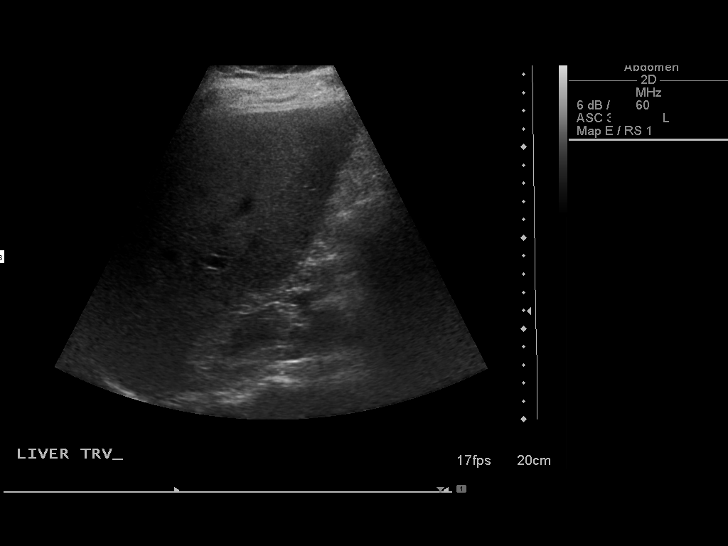
[im 38/76]
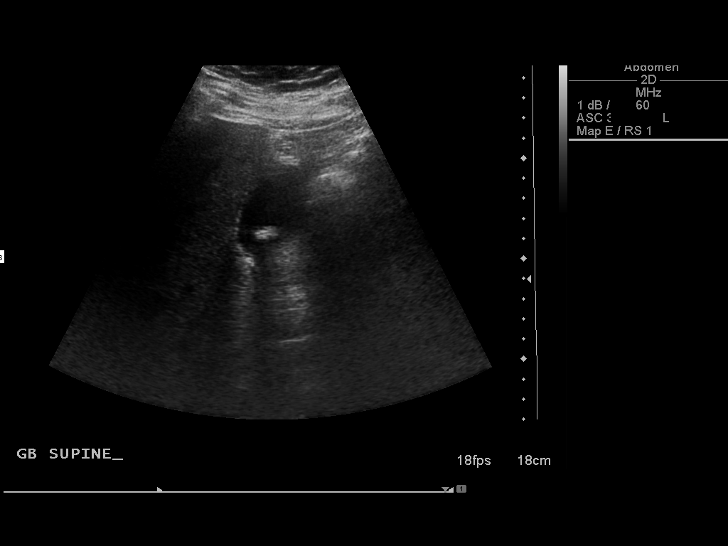
[im 44/76]
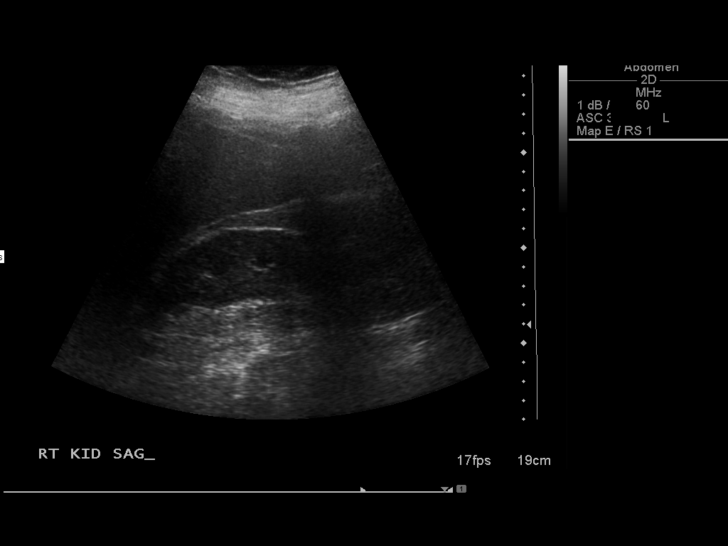
[im 51/76]
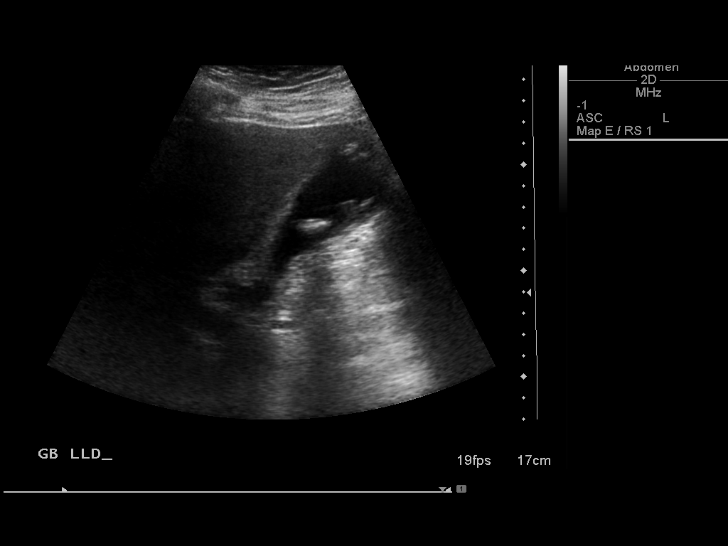
[im 57/76]
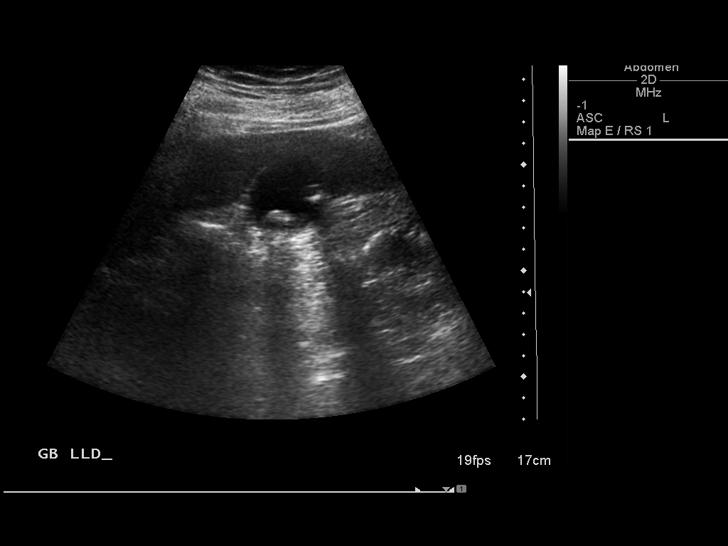
[im 63/76]
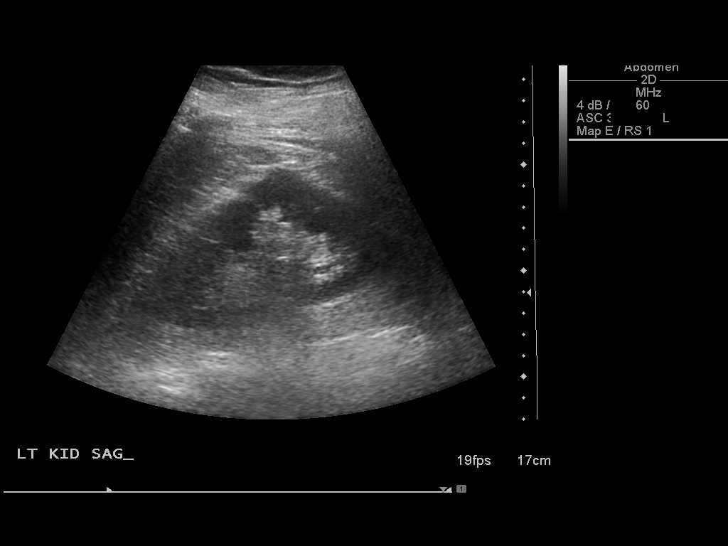
[im 69/76]
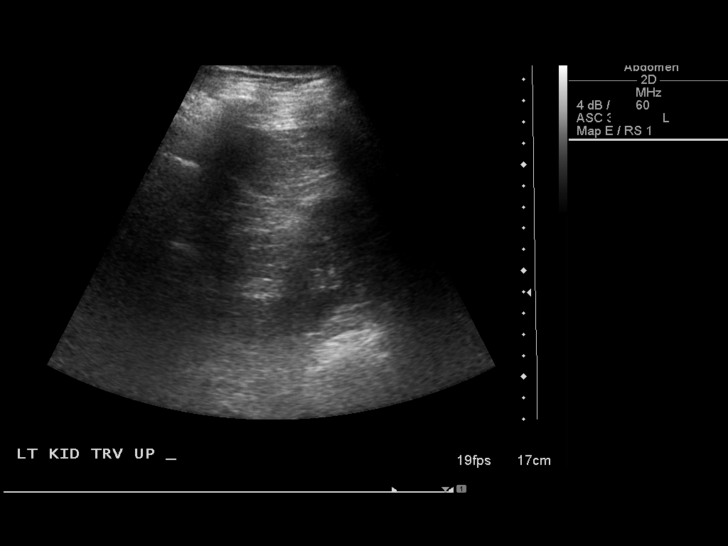
[im 76/76]
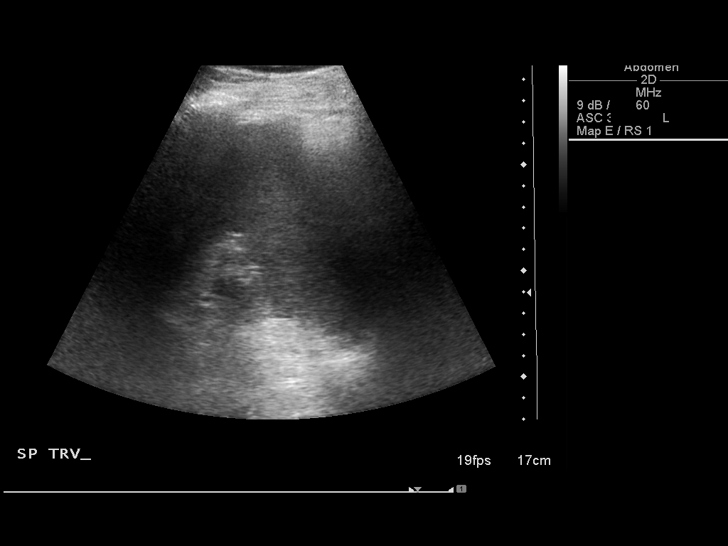

[13 of 25 positions shown; findings below may reference images not displayed]

FINDINGS: Gallbladder: There is cholelithiasis.  Multiple gallstones are seen
within the gallbladder.  The largest has a greatest diameter of
cm. The gallstones appear mobile. No gallbladder wall thickening
or pericholecystic fluid. The gallbladder wall thickness measured
2.7 mm. No sonographic Murphy's sign according to the ultrasound
technologist.

CBD: Normal in caliber measuring 5 mm. No choledocholithiasis is
evident.

Liver:  Normal size and echotexture without focal parenchymal
abnormality. Hepatic veins appear patent.  Portal vein is patent.
There is hepatopetal flow within the portal vein by color Doppler
imaging.

IVC:  Patent throughout its visualized course in the abdomen.

Pancreas:  Although the pancreas is difficult to visualize in its
entirety, no focal pancreatic abnormality is identified.  Portions
of body and tail are obscured by overlying bowel gas.

Spleen:  Normal size and echotexture without focal abnormality.
Length is 8 cm.

Right kidney:  No hydronephrosis.  Well-preserved cortex.  Normal
parenchymal echotexture without focal abnormalities.  Right renal
length is 12.3 cm.

Left kidney:  No hydronephrosis.  Well-preserved cortex.  Normal
parenchymal echotexture without focal abnormalities.  Left renal
length is 12.4 cm. There is dromedary hump configuration of the
left kidney.

Aorta:  Maximum diameter is or 2.2 cm.  No aneurysm is evident.

Ascites:  None.
IMPRESSION: Cholelithiasis.No ultrasonic evidence of acute
cholecystitis.

## 2015-12-31 ENCOUNTER — Other Ambulatory Visit: Payer: Self-pay | Admitting: Internal Medicine

## 2015-12-31 ENCOUNTER — Encounter: Payer: Self-pay | Admitting: Internal Medicine

## 2015-12-31 ENCOUNTER — Ambulatory Visit (INDEPENDENT_AMBULATORY_CARE_PROVIDER_SITE_OTHER): Payer: Commercial Managed Care - HMO | Admitting: Internal Medicine

## 2015-12-31 VITALS — BP 134/88 | HR 76 | Temp 97.3°F | Resp 16 | Ht 75.25 in | Wt 272.4 lb

## 2015-12-31 DIAGNOSIS — Z23 Encounter for immunization: Secondary | ICD-10-CM

## 2015-12-31 DIAGNOSIS — Z125 Encounter for screening for malignant neoplasm of prostate: Secondary | ICD-10-CM

## 2015-12-31 DIAGNOSIS — Z0000 Encounter for general adult medical examination without abnormal findings: Secondary | ICD-10-CM

## 2015-12-31 DIAGNOSIS — E559 Vitamin D deficiency, unspecified: Secondary | ICD-10-CM

## 2015-12-31 DIAGNOSIS — Z800 Family history of malignant neoplasm of digestive organs: Secondary | ICD-10-CM

## 2015-12-31 DIAGNOSIS — R5383 Other fatigue: Secondary | ICD-10-CM

## 2015-12-31 DIAGNOSIS — R030 Elevated blood-pressure reading, without diagnosis of hypertension: Secondary | ICD-10-CM | POA: Diagnosis not present

## 2015-12-31 DIAGNOSIS — Z79899 Other long term (current) drug therapy: Secondary | ICD-10-CM

## 2015-12-31 DIAGNOSIS — IMO0001 Reserved for inherently not codable concepts without codable children: Secondary | ICD-10-CM

## 2015-12-31 DIAGNOSIS — Z1212 Encounter for screening for malignant neoplasm of rectum: Secondary | ICD-10-CM

## 2015-12-31 DIAGNOSIS — Z111 Encounter for screening for respiratory tuberculosis: Secondary | ICD-10-CM | POA: Diagnosis not present

## 2015-12-31 DIAGNOSIS — R7303 Prediabetes: Secondary | ICD-10-CM

## 2015-12-31 DIAGNOSIS — E785 Hyperlipidemia, unspecified: Secondary | ICD-10-CM

## 2015-12-31 LAB — LIPID PANEL
CHOL/HDL RATIO: 4.3 ratio (ref <=5.0)
CHOLESTEROL: 158 mg/dL (ref 125–200)
HDL: 37 mg/dL — AB (ref >=40)
LDL Cholesterol: 87 mg/dL (ref <130)
TRIGLYCERIDES: 168 mg/dL — AB (ref <150)
VLDL: 34 mg/dL — AB (ref <30)

## 2015-12-31 LAB — HEPATIC FUNCTION PANEL
ALT: 22 U/L (ref 9–46)
AST: 18 U/L (ref 10–35)
Albumin: 3.8 g/dL (ref 3.6–5.1)
Alkaline Phosphatase: 84 U/L (ref 40–115)
BILIRUBIN DIRECT: 0.1 mg/dL (ref <=0.2)
BILIRUBIN INDIRECT: 0.5 mg/dL (ref 0.2–1.2)
BILIRUBIN TOTAL: 0.6 mg/dL (ref 0.2–1.2)
Total Protein: 6.5 g/dL (ref 6.1–8.1)

## 2015-12-31 LAB — CBC WITH DIFFERENTIAL/PLATELET
Basophils Absolute: 0.1 10*3/uL (ref 0.0–0.1)
Basophils Relative: 1 % (ref 0–1)
EOS ABS: 0.3 10*3/uL (ref 0.0–0.7)
EOS PCT: 3 % (ref 0–5)
HCT: 42.5 % (ref 39.0–52.0)
Hemoglobin: 13.9 g/dL (ref 13.0–17.0)
LYMPHS ABS: 2.2 10*3/uL (ref 0.7–4.0)
Lymphocytes Relative: 25 % (ref 12–46)
MCH: 28.8 pg (ref 26.0–34.0)
MCHC: 32.7 g/dL (ref 30.0–36.0)
MCV: 88 fL (ref 78.0–100.0)
MONO ABS: 0.5 10*3/uL (ref 0.1–1.0)
MONOS PCT: 6 % (ref 3–12)
MPV: 10.1 fL (ref 8.6–12.4)
Neutro Abs: 5.7 10*3/uL (ref 1.7–7.7)
Neutrophils Relative %: 65 % (ref 43–77)
PLATELETS: 323 10*3/uL (ref 150–400)
RBC: 4.83 MIL/uL (ref 4.22–5.81)
RDW: 13.4 % (ref 11.5–15.5)
WBC: 8.7 10*3/uL (ref 4.0–10.5)

## 2015-12-31 LAB — BASIC METABOLIC PANEL WITH GFR
BUN: 18 mg/dL (ref 7–25)
CALCIUM: 8.9 mg/dL (ref 8.6–10.3)
CO2: 24 mmol/L (ref 20–31)
CREATININE: 0.94 mg/dL (ref 0.70–1.33)
Chloride: 104 mmol/L (ref 98–110)
GFR, Est Non African American: >89 mL/min (ref >=60)
Glucose, Bld: 95 mg/dL (ref 65–99)
Potassium: 4.5 mmol/L (ref 3.5–5.3)
Sodium: 140 mmol/L (ref 135–146)

## 2015-12-31 LAB — MAGNESIUM: MAGNESIUM: 2.1 mg/dL (ref 1.5–2.5)

## 2015-12-31 LAB — IRON AND TIBC
%SAT: 40 % (ref 15–60)
IRON: 106 ug/dL (ref 50–180)
TIBC: 265 ug/dL (ref 250–425)
UIBC: 159 ug/dL (ref 125–400)

## 2015-12-31 MED ORDER — OMEPRAZOLE 40 MG PO CPDR: DELAYED_RELEASE_CAPSULE | ORAL | Status: DC

## 2015-12-31 NOTE — Patient Instructions (Signed)

## 2015-12-31 NOTE — Progress Notes (Signed)
Patient ID: Blake Johnson, male   DOB: [birth year], 55 y.o.   MRN: [id number]  Annual  Screening/Preventative Visit And Comprehensive Evaluation & Examination     This very nice 55 y.o. MWM presents for presents for a Wellness/Preventative Visit & comprehensive evaluation and management of multiple medical co-morbidities.  Patient is referred by his wife who has been an established patient for several years. Patient does report sx's of frequent HB with known dietary triggers and occasionally takes Omeprazole or Ranitidine. No definite waterbrash.  He also relate a chronic mild non-productive cough. Had GB surg in 2012 after admission with CP. Has Strong (+) FHx/o Colon Ca in [facility] & Pat[facility]. Had negative Colonoscopy in 2012 by Dr Fuller Plan.      Patient's BP is borderline elevated today at  BP: 134/88 mmHg. He denies knowledge of prior elevated BP's and further denies any cardiac symptoms as chest pain, palpitations, shortness of breath, dizziness or ankle swelling.     Patient is also screened for hyperlipidemia and disavows knowledge of prior elevated lipids.      As patient is Morbidly Obese (BMI 33+), he is expectantly screened for preDiabetes & insulin resistance. He denies reactive hypoglycemic symptoms, visual blurring, diabetic polys or paresthesias.     Finally, patient is screened expectantly for Vitamin D Deficiency.   Medication Sig  . omeprazole (PRILOSEC OTC) 20 MG tablet Take 20 mg by mouth once a week.    No Known Allergies   Past Medical History  Diagnosis Date  . GERD (gastroesophageal reflux disease)   . Chest pain   . Colon polyps   . Diverticulosis of colon     mild  02/06/2011  . Varicose veins    Health Maintenance  Topic Date Due  . Hepatitis C Screening  November 07, 1961  . HIV Screening  04/04/1975  . TETANUS/TDAP  04/04/1979  . COLONOSCOPY  04/03/2010  . INFLUENZA VACCINE  07/15/2015   Immunization History  Administered Date(s) Administered  . PPD Test 12/31/2015   . Tdap 12/31/2015   Past Surgical History  Procedure Laterality Date  . Cholecystectomy     Family History  Problem Relation Age of Onset  . Coronary artery disease Neg Hx   . Colon cancer Father & Paternal GrM     Social History   Social History  . Marital Status: Married    Spouse Name: N/A  . Number of Children: N/A  . Years of Education: N/A   Occupational History  . Not on file.   Social History Main Topics  . Smoking status: Never Smoker   . Smokeless tobacco: Never Used     Comment: 10 yrs ago  . Alcohol Use: Yes     Comment: 2-3 drinks weekly  . Drug Use: No  . Sexual Activity: Not on file   Other Topics Concern  . Not on file   Social History Narrative    ROS Constitutional: Denies fever, chills, weight loss/gain, headaches, insomnia,  night sweats or change in appetite. Does c/o fatigue. Eyes: Denies redness, blurred vision, diplopia, discharge, itchy or watery eyes.  ENT: Denies discharge, congestion, post nasal drip, epistaxis, sore throat, earache, hearing loss, dental pain, Tinnitus, Vertigo, Sinus pain or snoring.  Cardio: Denies chest pain, palpitations, irregular heartbeat, syncope, dyspnea, diaphoresis, orthopnea, PND, claudication or edema Respiratory: denies cough, dyspnea, DOE, pleurisy, hoarseness, laryngitis or wheezing.  Gastrointestinal: Denies dysphagia, heartburn, reflux, water brash, pain, cramps, nausea, vomiting, bloating, diarrhea, constipation, hematemesis, melena, hematochezia, jaundice or hemorrhoids Genitourinary:  Denies dysuria, frequency, urgency, nocturia, hesitancy, discharge, hematuria or flank pain Musculoskeletal: Denies arthralgia, myalgia, stiffness, Jt. Swelling, pain, limp or strain/sprain. Denies Falls. Skin: Denies puritis, rash, hives, warts, acne, eczema or change in skin lesion Neuro: No weakness, tremor, incoordination, spasms, paresthesia or pain Psychiatric: Denies confusion, memory loss or sensory loss. Denies  Depression. Endocrine: Denies change in weight, skin, hair change, nocturia, and paresthesia, diabetic polys, visual blurring or hyper / hypo glycemic episodes.  Heme/Lymph: No excessive bleeding, bruising or enlarged lymph nodes.  Physical Exam  BP 134/88 mmHg  Pulse 76  Temp(Src) 97.3 F (36.3 C)  Resp 16  Ht 6' 3.25" (1.911 m)  Wt 272 lb 6.4 oz (123.56 kg)  BMI 33.83 kg/m2  General Appearance: Over nourished, in no apparent distress.  Eyes: PERRLA, EOMs, conjunctiva no swelling or erythema, normal fundi and vessels. Sinuses: No frontal/maxillary tenderness ENT/Mouth: EACs patent / TMs  nl. Nares clear without erythema, swelling, mucoid exudates. Oral hygiene is good. No erythema, swelling, or exudate. Tongue normal, non-obstructing. Tonsils not swollen or erythematous. Hearing normal.  Neck: Supple, thyroid normal. No bruits, nodes or JVD. Respiratory: Respiratory effort normal.  BS equal and clear bilateral without rales, rhonci, wheezing or stridor. Cardio: Heart sounds are normal with regular rate and rhythm and no murmurs, rubs or gallops. Peripheral pulses are normal and equal bilaterally without edema. No aortic or femoral bruits. Chest: symmetric with normal excursions and percussion.  Abdomen: Soft, with Nl bowel sounds. Nontender, no guarding, rebound, hernias, masses, or organomegaly.  Lymphatics: Non tender without lymphadenopathy.  Genitourinary: No hernias.Testes nl. DRE - prostate nl for age - smooth & firm w/o nodules. Musculoskeletal: Full ROM all peripheral extremities, joint stability, 5/5 strength, and normal gait. Skin: Warm and dry without rashes, lesions, cyanosis, clubbing or  ecchymosis.  Neuro: Cranial nerves intact, reflexes equal bilaterally. Normal muscle tone, no cerebellar symptoms. Sensation intact.  Pysch: Alert and oriented X 3 with normal affect, insight and judgment appropriate.   Assessment and Plan  1. Annual Preventative/Screening Exam   -  Microalbumin / creatinine urine ratio - EKG 12-Lead - [place], RETROPERITNL ABD,  LTD - POC Hemoccult Bld/Stl  - Urinalysis, Routine w reflex microscopic - Urine culture - Vitamin B12 - Iron and TIBC - PSA - Testosterone - CBC with Differential/Platelet - BASIC METABOLIC PANEL WITH GFR - Hepatic function panel - Magnesium - Lipid panel - TSH - Hemoglobin A1c - Insulin, random   2. Elevated BP  - Microalbumin / creatinine urine ratio - EKG 12-Lead - [place], RETROPERITNL ABD,  LTD - TSH  3. Hyperlipidemia  - Lipid panel - TSH  4. Prediabetes  - Hemoglobin A1c - Insulin, random  5. Vitamin D deficiency  - VITAMIN D 25 Hydroxy   6. Screening for rectal cancer  - POC Hemoccult Bld/Stl   7. Other fatigue  - Vitamin B12 - Iron and TIBC - Testosterone - CBC with Differential/Platelet - TSH  8. Prostate cancer screening  - PSA  9. Medication management  - Urinalysis, Routine w reflex microscopic  - CBC with Differential/Platelet - BASIC METABOLIC PANEL WITH GFR - Hepatic function panel - Magnesium  10. FH: colon cancer  - Ambulatory referral to Gastroenterology  11. Screening examination for pulmonary tuberculosis  - PPD  12. Need for prophylactic vaccination with combined diphtheria-tetanus-pertussis (DTP) vaccine  - Tdap vaccine greater than or equal to 7yo IM   Continue prudent diet as discussed, weight control, BP monitoring, regular exercise, and medications  as discussed.  Discussed med effects and SE's. Routine screening labs and tests as requested with regular follow-up as recommended. Over 40 minutes of exam, counseling, chart review and high complex critical decision making was performed

## 2016-01-01 ENCOUNTER — Other Ambulatory Visit: Payer: Self-pay | Admitting: Internal Medicine

## 2016-01-01 LAB — INSULIN, RANDOM: Insulin: 10.1 u[IU]/mL (ref 2.0–19.6)

## 2016-01-01 LAB — URINALYSIS, ROUTINE W REFLEX MICROSCOPIC
BILIRUBIN URINE: NEGATIVE
GLUCOSE, UA: NEGATIVE
Hgb urine dipstick: NEGATIVE
KETONES UR: NEGATIVE
Leukocytes, UA: NEGATIVE
Nitrite: NEGATIVE
PH: 5 (ref 5.0–8.0)
Protein, ur: NEGATIVE
SPECIFIC GRAVITY, URINE: 1.026 (ref 1.001–1.035)

## 2016-01-01 LAB — HEMOGLOBIN A1C
HEMOGLOBIN A1C: 5.8 % — AB (ref <5.7)
Mean Plasma Glucose: 120 mg/dL — ABNORMAL HIGH (ref <117)

## 2016-01-01 LAB — VITAMIN D 25 HYDROXY (VIT D DEFICIENCY, FRACTURES): VIT D 25 HYDROXY: 26 ng/mL — AB (ref 30–100)

## 2016-01-01 LAB — URINE CULTURE: Colony Count: 60000

## 2016-01-01 LAB — MICROALBUMIN / CREATININE URINE RATIO
CREATININE, URINE: 210 mg/dL (ref 20–370)
Microalb Creat Ratio: 1 mcg/mg creat (ref <30)
Microalb, Ur: 0.2 mg/dL

## 2016-01-01 LAB — VITAMIN B12: Vitamin B-12: 403 pg/mL (ref 211–911)

## 2016-01-01 LAB — TSH: TSH: 3.44 u[IU]/mL (ref 0.350–4.500)

## 2016-01-01 LAB — TESTOSTERONE: Testosterone: 171 ng/dL — ABNORMAL LOW (ref 250–827)

## 2016-01-01 LAB — PSA: PSA: 1.44 ng/mL (ref <=4.00)

## 2016-01-01 MED ORDER — AMOXICILLIN 250 MG PO CAPS: ORAL_CAPSULE | ORAL | Status: DC

## 2016-01-06 ENCOUNTER — Encounter: Payer: Self-pay | Admitting: Internal Medicine

## 2016-01-06 LAB — TB SKIN TEST
Induration: 0 mm
TB Skin Test: NEGATIVE

## 2016-01-07 ENCOUNTER — Other Ambulatory Visit: Payer: Self-pay | Admitting: Internal Medicine

## 2016-01-07 MED ORDER — TESTOSTERONE 20.25 MG/ACT (1.62%) TD GEL: TRANSDERMAL | Status: DC

## 2016-01-15 ENCOUNTER — Encounter: Payer: Self-pay | Admitting: Gastroenterology

## 2016-01-28 ENCOUNTER — Telehealth: Payer: Self-pay | Admitting: *Deleted

## 2016-01-28 NOTE — Telephone Encounter (Signed)
Called patient to inform him that the Androgel Pump is a plan exclusion on his insurance plan, but the cost on Testosterone Cypionate for injection is $60.16 for a 10 ml bottle, that is approximately 5 shots.  This price is for cash pay.  The patient will call back if he decides to try the injection.

## 2016-02-07 ENCOUNTER — Ambulatory Visit (INDEPENDENT_AMBULATORY_CARE_PROVIDER_SITE_OTHER): Payer: Commercial Managed Care - HMO | Admitting: *Deleted

## 2016-02-07 DIAGNOSIS — N390 Urinary tract infection, site not specified: Secondary | ICD-10-CM

## 2016-02-07 NOTE — Progress Notes (Signed)
Patient ID: Blake Johnson, male   DOB: Aug 18, 1961, 55 y.o.   MRN: [id number] Patient presents for 1 month recheck UA, C&S.  Patient completed abx AD and denies any current UTI symptoms.

## 2016-02-08 ENCOUNTER — Other Ambulatory Visit: Payer: Self-pay | Admitting: Internal Medicine

## 2016-02-08 LAB — URINALYSIS, ROUTINE W REFLEX MICROSCOPIC
Bilirubin Urine: NEGATIVE
GLUCOSE, UA: NEGATIVE
Hgb urine dipstick: NEGATIVE
Ketones, ur: NEGATIVE
LEUKOCYTES UA: NEGATIVE
NITRITE: NEGATIVE
PH: 6 (ref 5.0–8.0)
PROTEIN: NEGATIVE
Specific Gravity, Urine: 1.006 (ref 1.001–1.035)

## 2016-02-09 ENCOUNTER — Other Ambulatory Visit: Payer: Self-pay | Admitting: Internal Medicine

## 2016-02-09 LAB — URINE CULTURE

## 2016-07-21 ENCOUNTER — Encounter: Payer: Self-pay | Admitting: Gastroenterology

## 2016-08-05 ENCOUNTER — Encounter: Payer: Self-pay | Admitting: Internal Medicine

## 2016-08-05 ENCOUNTER — Ambulatory Visit (INDEPENDENT_AMBULATORY_CARE_PROVIDER_SITE_OTHER): Payer: Commercial Managed Care - HMO | Admitting: Internal Medicine

## 2016-08-05 VITALS — BP 126/88 | HR 80 | Temp 97.3°F | Resp 16 | Ht 75.25 in | Wt 261.4 lb

## 2016-08-05 DIAGNOSIS — R55 Syncope and collapse: Secondary | ICD-10-CM | POA: Diagnosis not present

## 2016-08-05 DIAGNOSIS — R7989 Other specified abnormal findings of blood chemistry: Secondary | ICD-10-CM

## 2016-08-05 DIAGNOSIS — R946 Abnormal results of thyroid function studies: Secondary | ICD-10-CM | POA: Diagnosis not present

## 2016-08-05 LAB — TSH: TSH: 2.54 mIU/L (ref 0.40–4.50)

## 2016-08-05 NOTE — Progress Notes (Signed)
  Subjective:    Patient ID: Blake Johnson, male    DOB: [birth year], 55 y.o.   MRN: [id number]  HPI  This very nice 55 yo MWM in general good health was visiting in [place] with his daughter starting college there and at a restaurant had a near syncopal episode and had a very thorough negative cardiac w/u with negative stress Cardiolite w/EF 71%. The only abn lab was a sl elevated TSH4.520 ansd in detail he denies an sx's of thyroid deficiency.   Medication Sig  . omeprazole (PRILOSEC) 40 MG capsule Take 1 capsule daily for Heartburn & Reflux  . amoxicillin (AMOXIL) 250 MG capsule Take 1 capsule 3 x day with meals for infection  . Testosterone 20.25 MG/ACT (1.62%) GEL Apply 2 pumps daily   No Known Allergies   Past Medical History:  Diagnosis Date  . Chest pain   . Colon polyps   . Diverticulosis of colon    mild  02/06/2011  . GERD (gastroesophageal reflux disease)   . Varicose veins    Review of Systems 10 point systems review negative except as above.    Objective:   Physical Exam  BP 126/88   Pulse 80   Temp 97.3 F (36.3 C)   Resp 16   Ht 6' 3.25" (1.911 m)   Wt 261 lb 6.4 oz (118.6 kg)   BMI 32.46 kg/m   Skin - clear w/o rash, cyanosis or icterus.  HEENT - Eac's patent. TM's Nl. EOM's full. PERRLA. NasoOroPharynx clear. Neck - supple. Nl Thyroid. Carotids 2+ & No bruits, nodes, JVD Chest - Clear. Cor - Nl HS. RRR w/o sig M. PP 1(+). No edema.  MS- FROM w/o deformities. Muscle power, tone and bulk Nl. Gait Nl. Neuro - No obvious Cr N abnormalities.  Nl w/o focal abnormalities.    Assessment & Plan:   1. Syncope, unspecified   - presumed simple vasovagal reaction  2. Elevated TSH  - TSH

## 2016-08-05 NOTE — Patient Instructions (Addendum)
Thyroid-Stimulating Hormone Test WHY AM I HAVING THIS TEST? A thyroid-stimulating hormone (TSH) test is a blood test that is done to measure the level of TSH, also known as thyrotropin, in your blood. TSH is produced by the pituitary gland. The pituitary gland is a small organ located just below the brain, behind your eyes and nasal passages. It is part of a system that monitors and maintains thyroid hormone levels and thyroid gland function. Thyroid hormones affect many body parts and systems, including the system that affects how quickly your body burns fuel for energy. Your health care provider may recommend testing your TSH level if you have signs and symptoms of abnormal thyroid hormone levels. Knowing the level of TSH in your blood can help your health care provider:  Diagnose a thyroid gland or pituitary gland disorder.  Manage your condition and treatment if you have hypothyroidism or hyperthyroidism. WHAT KIND OF SAMPLE IS TAKEN? A blood sample is required for this test. It is usually collected by inserting a needle into a vein. HOW DO I PREPARE FOR THE TEST? There is no preparation required for this test. WHAT ARE THE REFERENCE RANGES? Reference rangesare considered healthy rangesestablished after testing a large group of healthy people. Reference rangesmay vary among different people, labs, and hospitals. It is your responsibility to obtain your test results. Ask the lab or department performing the test when and how you will get your results. Range of Normal Values:  Adult: 0.3-5 microunits/mL or 0.3-5 milliunits/L (SI units).  Newborn: 16-18 microunits/mL or 3-18 milliunits/L.  Cord: 3-12 microunits/mL or 3-12 milliunits/L. WHAT DO THE RESULTS MEAN? A high level of TSH may mean:  Your thyroid gland is not making enough thyroid hormones. When the thyroid gland does not make enough thyroid hormones, the pituitary gland releases TSH into the bloodstream. The higher-than-normal  levels of TSH prompt the thyroid gland to release more thyroid hormones.  You are getting an insufficient level of thyroid hormone medicine, if you are receiving this type of treatment.  There is a problem with the pituitary gland (rare). A low level of TSH can indicate a problem with the pituitary gland. Talk with your health care provider to discuss your results, treatment options, and if necessary, the need for more tests. Talk with your health care provider if you have any questions about your results.

## 2016-08-10 ENCOUNTER — Telehealth: Payer: Self-pay

## 2016-08-10 ENCOUNTER — Ambulatory Visit (AMBULATORY_SURGERY_CENTER): Payer: Self-pay

## 2016-08-10 VITALS — Ht 74.0 in | Wt 268.0 lb

## 2016-08-10 DIAGNOSIS — Z86010 Personal history of colonic polyps: Secondary | ICD-10-CM

## 2016-08-10 DIAGNOSIS — Z800 Family history of malignant neoplasm of digestive organs: Secondary | ICD-10-CM

## 2016-08-10 MED ORDER — NA SULFATE-K SULFATE-MG SULF 17.5-3.13-1.6 GM/177ML PO SOLN: ORAL | 0 refills | Status: DC

## 2016-08-10 NOTE — Telephone Encounter (Signed)
Dr Stacie Glaze pt is scheduled for a colonoscopy with you on 08/21/16.He states he was seen in the ER in [place] 3 weeks ago due to a fainting spell.He states his ECHO and blood work were negative. Does he need an office visit or is a direct colon OK? Please advise,Thanks

## 2016-08-10 NOTE — Telephone Encounter (Signed)
Reviewed PCP note dated 08/05/16 regarding event in [place]. He is ok for direct colonoscopy.

## 2016-08-10 NOTE — Telephone Encounter (Signed)
OK for colon at [facility] per Dr Fuller Plan.Will proceed as scheduled. The patient was notified!

## 2016-08-10 NOTE — Progress Notes (Signed)
Per pt, no allergies to soy or egg products.Pt not taking any weight loss meds or using  O2 at home. 

## 2016-08-13 ENCOUNTER — Encounter: Payer: Self-pay | Admitting: Gastroenterology

## 2016-08-13 ENCOUNTER — Telehealth: Payer: Self-pay | Admitting: Gastroenterology

## 2016-08-13 MED ORDER — PEG 3350-KCL-NABCB-NACL-NASULF 240 G PO SOLR: ORAL | 0 refills | Status: DC

## 2016-08-13 NOTE — Telephone Encounter (Signed)
I called patient and offered the Suprep 30 % coupon. Patient states he still cannot afford the prep and he called his insurance company to find out what preps were covered. I informed patient that he can do Colyte prep and I can mail him new instructions and that will be cheaper for him than Suprep. Patient verbalized understanding and he wants them faxed to him instead. Faxed instructions to [phone]. New prep sent to pharmacy.

## 2016-08-21 ENCOUNTER — Encounter: Payer: Self-pay | Admitting: Gastroenterology

## 2016-08-21 ENCOUNTER — Ambulatory Visit (AMBULATORY_SURGERY_CENTER): Payer: Commercial Managed Care - HMO | Admitting: Gastroenterology

## 2016-08-21 VITALS — BP 127/76 | HR 69 | Temp 98.4°F | Resp 11 | Ht 74.0 in | Wt 268.0 lb

## 2016-08-21 DIAGNOSIS — Z86010 Personal history of colonic polyps: Secondary | ICD-10-CM

## 2016-08-21 DIAGNOSIS — D123 Benign neoplasm of transverse colon: Secondary | ICD-10-CM

## 2016-08-21 DIAGNOSIS — K635 Polyp of colon: Secondary | ICD-10-CM | POA: Diagnosis not present

## 2016-08-21 MED ORDER — SODIUM CHLORIDE 0.9 % IV SOLN: 500.0000 mL | INTRAVENOUS | Status: DC

## 2016-08-21 NOTE — Progress Notes (Signed)
Report to PACU, RN, vss, BBS= Clear.  

## 2016-08-21 NOTE — Op Note (Signed)
[facility] Patient Name: Blake Johnson Procedure Date: 08/21/2016 2:44 PM MRN: [id number] Endoscopist: Ladene Artist , MD Age: 55 Referring MD:  Date of Birth: August 18, 1961 Gender: Male Account #: [id number] Procedure:                Colonoscopy Indications:              Surveillance: Personal history of adenomatous                            polyps on last colonoscopy > 5 years ago Medicines:                Monitored Anesthesia Care Procedure:                Pre-Anesthesia Assessment:                           - Prior to the procedure, a History and Physical                            was performed, and patient medications and                            allergies were reviewed. The patient's tolerance of                            previous anesthesia was also reviewed. The risks                            and benefits of the procedure and the sedation                            options and risks were discussed with the patient.                            All questions were answered, and informed consent                            was obtained. Prior Anticoagulants: The patient has                            taken no previous anticoagulant or antiplatelet                            agents. ASA Grade Assessment: II - A patient with                            mild systemic disease. After reviewing the risks                            and benefits, the patient was deemed in                            satisfactory condition to undergo the procedure.  After obtaining informed consent, the colonoscope                            was passed under direct vision. Throughout the                            procedure, the patient's blood pressure, pulse, and                            oxygen saturations were monitored continuously. The                            Model PCF-H190L [id number]) scope was introduced                            through the anus and  advanced to the the cecum,                            identified by appendiceal orifice and ileocecal                            valve. The ileocecal valve, appendiceal orifice,                            and rectum were photographed. The quality of the                            bowel preparation was excellent. The colonoscopy                            was performed without difficulty. The patient                            tolerated the procedure well. Scope In: 3:01:30 PM Scope Out: 3:16:58 PM Scope Withdrawal Time: 0 hours 11 minutes 26 seconds  Total Procedure Duration: 0 hours 15 minutes 28 seconds  Findings:                 A 6 mm polyp was found in the transverse colon. The                            polyp was sessile. The polyp was removed with a                            cold snare. Resection and retrieval were complete.                           Internal hemorrhoids were found during                            retroflexion. The hemorrhoids were small and Grade                            I (internal hemorrhoids that do not prolapse).  The exam was otherwise without abnormality on                            direct and retroflexion views.                           A few small-mouthed diverticula were found in the                            sigmoid colon. Complications:            No immediate complications. Estimated blood loss:                            None. Estimated Blood Loss:     Estimated blood loss: none. Impression:               - One 6 mm polyp in the transverse colon, removed                            with a cold snare. Resected and retrieved.                           - Internal hemorrhoids.                           - The examination was otherwise normal on direct                            and retroflexion views.                           - Diverticulosis in the sigmoid colon. Recommendation:           - Repeat colonoscopy in 5 years  for surveillance.                           - Patient has a contact number available for                            emergencies. The signs and symptoms of potential                            delayed complications were discussed with the                            patient. Return to normal activities tomorrow.                            Written discharge instructions were provided to the                            patient.                           - Resume previous diet.                           -  Continue present medications.                           - Await pathology results. Ladene Artist, MD 08/21/2016 3:19:57 PM This report has been signed electronically.

## 2016-08-21 NOTE — Progress Notes (Signed)
Called to room to assist during endoscopic procedure.  Patient ID and intended procedure confirmed with present staff. Received instructions for my participation in the procedure from the performing physician.  

## 2016-08-21 NOTE — Patient Instructions (Signed)
YOU HAD AN ENDOSCOPIC PROCEDURE TODAY AT THE [facility]:   Refer to the procedure report that was given to you for any specific questions about what was found during the examination.  If the procedure report does not answer your questions, please call your gastroenterologist to clarify.  If you requested that your care partner not be given the details of your procedure findings, then the procedure report has been included in a sealed envelope for you to review at your convenience later.  YOU SHOULD EXPECT: Some feelings of bloating in the abdomen. Passage of more gas than usual.  Walking can help get rid of the air that was put into your GI tract during the procedure and reduce the bloating. If you had a lower endoscopy (such as a colonoscopy or flexible sigmoidoscopy) you may notice spotting of blood in your stool or on the toilet paper. If you underwent a bowel prep for your procedure, you may not have a normal bowel movement for a few days.  Please Note:  You might notice some irritation and congestion in your nose or some drainage.  This is from the oxygen used during your procedure.  There is no need for concern and it should clear up in a day or so.  SYMPTOMS TO REPORT IMMEDIATELY:   Following lower endoscopy (colonoscopy or flexible sigmoidoscopy):  Excessive amounts of blood in the stool  Significant tenderness or worsening of abdominal pains  Swelling of the abdomen that is new, acute  Fever of 100F or higher    For urgent or emergent issues, a gastroenterologist can be reached at any hour by calling [phone].   DIET:  We do recommend a small meal at first, but then you may proceed to your regular diet.  Drink plenty of fluids but you should avoid alcoholic beverages for 24 hours.  ACTIVITY:  You should plan to take it easy for the rest of today and you should NOT DRIVE or use heavy machinery until tomorrow (because of the sedation medicines used during the test).     FOLLOW UP: Our staff will call the number listed on your records the next business day following your procedure to check on you and address any questions or concerns that you may have regarding the information given to you following your procedure. If we do not reach you, we will leave a message.  However, if you are feeling well and you are not experiencing any problems, there is no need to return our call.  We will assume that you have returned to your regular daily activities without incident.  If any biopsies were taken you will be contacted by phone or by letter within the next 1-3 weeks.  Please call us at [phone] if you have not heard about the biopsies in 3 weeks.    SIGNATURES/CONFIDENTIALITY: You and/or your care partner have signed paperwork which will be entered into your electronic medical record.  These signatures attest to the fact that that the information above on your After Visit Summary has been reviewed and is understood.  Full responsibility of the confidentiality of this discharge information lies with you and/or your care-partner.   Resume medications. Information given on polyps,diverticulosis,hemorrhoids and high fiber diet. 

## 2016-08-24 ENCOUNTER — Telehealth: Payer: Self-pay | Admitting: *Deleted

## 2016-08-24 NOTE — Telephone Encounter (Signed)
  Follow up Call-  Call back number 08/21/2016  Post procedure Call Back phone  # [phone]  Permission to leave phone message Yes  Some recent data might be hidden     Patient questions:  Do you have a fever, pain , or abdominal swelling? No. Pain Score  0 *  Have you tolerated food without any problems? Yes.    Have you been able to return to your normal activities? Yes.    Do you have any questions about your discharge instructions: Diet   No. Medications  No. Follow up visit  No.  Do you have questions or concerns about your Care? No.  Actions: * If pain score is 4 or above: No action needed, pain <4.

## 2016-08-27 ENCOUNTER — Encounter: Payer: Self-pay | Admitting: Gastroenterology

## 2017-01-05 ENCOUNTER — Encounter: Payer: Self-pay | Admitting: Internal Medicine

## 2017-01-05 ENCOUNTER — Ambulatory Visit (INDEPENDENT_AMBULATORY_CARE_PROVIDER_SITE_OTHER): Payer: Commercial Managed Care - HMO | Admitting: Internal Medicine

## 2017-01-05 VITALS — BP 122/78 | HR 80 | Temp 97.8°F | Resp 16 | Ht 75.0 in | Wt 268.8 lb

## 2017-01-05 DIAGNOSIS — Z79899 Other long term (current) drug therapy: Secondary | ICD-10-CM | POA: Insufficient documentation

## 2017-01-05 DIAGNOSIS — E782 Mixed hyperlipidemia: Secondary | ICD-10-CM | POA: Insufficient documentation

## 2017-01-05 DIAGNOSIS — R030 Elevated blood-pressure reading, without diagnosis of hypertension: Secondary | ICD-10-CM

## 2017-01-05 DIAGNOSIS — R5383 Other fatigue: Secondary | ICD-10-CM | POA: Insufficient documentation

## 2017-01-05 DIAGNOSIS — I10 Essential (primary) hypertension: Secondary | ICD-10-CM | POA: Diagnosis not present

## 2017-01-05 DIAGNOSIS — Z1212 Encounter for screening for malignant neoplasm of rectum: Secondary | ICD-10-CM

## 2017-01-05 DIAGNOSIS — Z111 Encounter for screening for respiratory tuberculosis: Secondary | ICD-10-CM | POA: Diagnosis not present

## 2017-01-05 DIAGNOSIS — E559 Vitamin D deficiency, unspecified: Secondary | ICD-10-CM | POA: Insufficient documentation

## 2017-01-05 DIAGNOSIS — Z136 Encounter for screening for cardiovascular disorders: Secondary | ICD-10-CM

## 2017-01-05 DIAGNOSIS — Z125 Encounter for screening for malignant neoplasm of prostate: Secondary | ICD-10-CM

## 2017-01-05 DIAGNOSIS — R7303 Prediabetes: Secondary | ICD-10-CM | POA: Insufficient documentation

## 2017-01-05 DIAGNOSIS — Z0001 Encounter for general adult medical examination with abnormal findings: Secondary | ICD-10-CM

## 2017-01-05 DIAGNOSIS — Z0000 Encounter for general adult medical examination without abnormal findings: Secondary | ICD-10-CM

## 2017-01-05 DIAGNOSIS — Z800 Family history of malignant neoplasm of digestive organs: Secondary | ICD-10-CM

## 2017-01-05 LAB — CBC WITH DIFFERENTIAL/PLATELET
BASOS ABS: 0 {cells}/uL (ref 0–200)
Basophils Relative: 0 %
EOS ABS: 198 {cells}/uL (ref 15–500)
EOS PCT: 2 %
HCT: 43.6 % (ref 38.5–50.0)
Hemoglobin: 14.3 g/dL (ref 13.2–17.1)
LYMPHS PCT: 23 %
Lymphs Abs: 2277 cells/uL (ref 850–3900)
MCH: 29 pg (ref 27.0–33.0)
MCHC: 32.8 g/dL (ref 32.0–36.0)
MCV: 88.4 fL (ref 80.0–100.0)
MONOS PCT: 6 %
MPV: 10 fL (ref 7.5–12.5)
Monocytes Absolute: 594 cells/uL (ref 200–950)
NEUTROS PCT: 69 %
Neutro Abs: 6831 cells/uL (ref 1500–7800)
PLATELETS: 325 10*3/uL (ref 140–400)
RBC: 4.93 MIL/uL (ref 4.20–5.80)
RDW: 13.6 % (ref 11.0–15.0)
WBC: 9.9 10*3/uL (ref 3.8–10.8)

## 2017-01-05 LAB — PSA: PSA: 1.2 ng/mL (ref <=4.0)

## 2017-01-05 LAB — VITAMIN B12: VITAMIN B 12: 417 pg/mL (ref 200–1100)

## 2017-01-05 LAB — TSH: TSH: 2.09 m[IU]/L (ref 0.40–4.50)

## 2017-01-05 NOTE — Patient Instructions (Signed)

## 2017-01-05 NOTE — Progress Notes (Signed)
[facility]   Unk Pinto, M.D.    Uvaldo Bristle. Silverio Lay, P.A.-C      Starlyn Skeans, P.A.-C  [facility]                [address] [place] [id number] Telephone [phone] Telefax [phone] Annual  Screening/Preventative Visit  & Comprehensive Evaluation & Examination     This very nice 56 y.o. MWM presents for a Screening/Preventative Visit & comprehensive evaluation and management of multiple medical co-morbidities.  Patient has been followed expectantly for elevated BP, Prediabetes, Hyperlipidemia and Vitamin D Deficiency. Patient also has hx/o infrequent GERD sx's usu controlled with OTC Omeprazole/Ranitidine.      Patient has strong FHx of Colon Cancer in father and paternal grandmother and had Neg Colonoscopies in 2012 and recently in Aug 2017.      Patient is monitored expectantly for elevated BP with a borderline elevated BP of 134/88 in Jan 2017. In Aug he had a negative Stress Cardiolite w/EF 77% .  Patient's BP has been controlled at home.  Today's BP: 122/78. Patient denies any cardiac symptoms as chest pain, palpitations, shortness of breath, dizziness or ankle swelling.     Patient's hyperlipidemia is controlled with diet and medications. Patient denies myalgias or other medication SE's. Last lipids were at goal albeit elevated Triglycerides: Lab Results  Component Value Date   CHOL 158 12/31/2015   HDL 37 (L) 12/31/2015   LDLCALC 87 12/31/2015   TRIG 168 (H) 12/31/2015   CHOLHDL 4.3 12/31/2015      Patient has hx/o Morbid Obesity and is screened expectantly for prediabetes and did have an elevated A1c 5.8% in Jan 2017.  Patient denies reactive hypoglycemic symptoms, visual blurring, diabetic polys or paresthesias. Last A1c was not at goal: Lab Results  Component Value Date   HGBA1C 5.8 (H) 12/31/2015       Patient had low Testosterone 171 in Jan 2017 and was recommended  Testosterone gel which he never got due to the high co-pay. Finally, patient has history of Vitamin D Deficiency of "99" in Jan 2017 and is only taking 1,000 units (not the recommended 10,000 units).  Current Outpatient Prescriptions on File Prior to Visit  Medication Sig  . Cholecalciferol (VITAMIN D-1000 MAX ST) 1000 units tablet Take 10,000 Units by mouth daily.  . fluticasone (FLONASE) 50 MCG/ACT nasal spray Place into both nostrils as needed for allergies or rhinitis.  Marland Kitchen loratadine (CLARITIN) 10 MG tablet Take 10 mg by mouth every 24 hours as needed for Allergies.  . Multiple Vitamin (MULTI-VITAMINS) TABS Take 1 tablet by mouth daily.  Marland Kitchen omeprazole (PRILOSEC OTC) 20 MG tablet Take 20 mg by mouth every 24 hours as needed (heartburn).   No Known Allergies   Past Medical History:  Diagnosis Date  . 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 Chest pain   . Colon polyps   . Diverticulosis of colon    mild  02/06/2011  . GERD (gastroesophageal reflux disease)   . History of fainting    3 weeks ago in [place]/went to [facility] ER  . Varicose veins    Health Maintenance  Topic Date Due  . Hepatitis C Screening  Nov 18, 1961  . HIV Screening  04/04/1975  . INFLUENZA VACCINE  07/14/2016  . COLONOSCOPY  08/21/2021  . TETANUS/TDAP  12/30/2025  Immunization History  Administered Date(s) Administered  . PPD Test 12/31/2015, 01/05/2017  . Tdap 12/31/2015   Past Surgical History:  Procedure Laterality Date  . BASAL CELL CARCINOMA EXCISION     removed from back,shoulder blade, and lower back  . CHOLECYSTECTOMY    . PILONIDAL CYST EXCISION    . VARICOSE VEIN SURGERY     right leg   Family History  Problem Relation Age of Onset  . Colon cancer Father   . Cancer Father   . Alzheimer's disease Mother   . Colon cancer Paternal Aunt   . Colon cancer Paternal Grandmother   . Coronary artery disease Neg Hx    Social History   Social History  . Marital status:  Married    Spouse name: N/A  . Number of children: 4 -> 2 sons and 2 daughters  . Years of education: N/A   Occupational History  . Engineer   Social History Main Topics  . Smoking status: Never Smoker  . Smokeless tobacco: Never Used     Comment: 10 yrs ago  . Alcohol use 1.2 - 1.8 oz/week    2 - 3 Glasses of wine per week     Comment: 2-3 drinks weekly  . Drug use: No  . Sexual activity: Not on file    ROS Constitutional: Denies fever, chills, weight loss/gain, headaches, insomnia,  night sweats or change in appetite. Does c/o fatigue. Eyes: Denies redness, blurred vision, diplopia, discharge, itchy or watery eyes.  ENT: Denies discharge, congestion, post nasal drip, epistaxis, sore throat, earache, hearing loss, dental pain, Tinnitus, Vertigo, Sinus pain or snoring.  Cardio: Denies chest pain, palpitations, irregular heartbeat, syncope, dyspnea, diaphoresis, orthopnea, PND, claudication or edema Respiratory: denies cough, dyspnea, DOE, pleurisy, hoarseness, laryngitis or wheezing.  Gastrointestinal: Denies dysphagia, heartburn, reflux, water brash, pain, cramps, nausea, vomiting, bloating, diarrhea, constipation, hematemesis, melena, hematochezia, jaundice or hemorrhoids Genitourinary: Denies dysuria, frequency, urgency, nocturia, hesitancy, discharge, hematuria or flank pain Musculoskeletal: Denies arthralgia, myalgia, stiffness, Jt. Swelling, pain, limp or strain/sprain. Denies Falls. Skin: Denies puritis, rash, hives, warts, acne, eczema or change in skin lesion Neuro: No weakness, tremor, incoordination, spasms, paresthesia or pain Psychiatric: Denies confusion, memory loss or sensory loss. Denies Depression. Endocrine: Denies change in weight, skin, hair change, nocturia, and paresthesia, diabetic polys, visual blurring or hyper / hypo glycemic episodes.  Heme/Lymph: No excessive bleeding, bruising or enlarged lymph nodes.  Physical Exam  BP 122/78   Pulse 80   Temp 97.8  F (36.6 C)   Resp 16   Ht 6\' 3"  (1.905 m)   Wt 268 lb 12.8 oz (121.9 kg)   BMI 33.60 kg/m   General Appearance: Over nourished, in no apparent distress.  Eyes: PERRLA, EOMs, conjunctiva no swelling or erythema, normal fundi and vessels. Sinuses: No frontal/maxillary tenderness ENT/Mouth: EACs patent / TMs  nl. Nares clear without erythema, swelling, mucoid exudates. Oral hygiene is good. No erythema, swelling, or exudate. Tongue normal, non-obstructing. Tonsils not swollen or erythematous. Hearing normal.  Neck: Supple, thyroid normal. No bruits, nodes or JVD. Respiratory: Respiratory effort normal.  BS equal and clear bilateral without rales, rhonci, wheezing or stridor. Cardio: Heart sounds are normal with regular rate and rhythm and no murmurs, rubs or gallops. Peripheral pulses are normal and equal bilaterally without edema. No aortic or femoral bruits. Chest: symmetric with normal excursions and percussion.  Abdomen: Soft, with Nl bowel sounds. Nontender, no guarding, rebound, hernias, masses, or organomegaly.  Lymphatics: Non tender without  lymphadenopathy.  Genitourinary: No hernias.Testes nl. DRE - prostate nl for age - smooth & firm w/o nodules. Musculoskeletal: Full ROM all peripheral extremities, joint stability, 5/5 strength, and normal gait. Skin: Warm and dry without rashes, lesions, cyanosis, clubbing or  ecchymosis.  Neuro: Cranial nerves intact, reflexes equal bilaterally. Normal muscle tone, no cerebellar symptoms. Sensation intact.  Pysch: Alert and oriented X 3 with normal affect, insight and judgment appropriate.   Assessment and Plan  1. Annual Preventative/Screening Exam    2. Elevated BP without diagnosis of hypertension  - Microalbumin / creatinine urine ratio - EKG 12-Lead - [place], RETROPERITNL ABD,  LTD - CBC with Differential/Platelet - BASIC METABOLIC PANEL WITH GFR - TSH  3. Mixed hyperlipidemia  - EKG 12-Lead - [place], RETROPERITNL ABD,  LTD -  Hepatic function panel - Lipid panel - TSH  4. Prediabetes  - EKG 12-Lead - [place], RETROPERITNL ABD,  LTD - Hemoglobin A1c - Insulin, random  5. Vitamin D deficiency  - VITAMIN D 25 Hydroxy   6. Screening for rectal cancer  - POC Hemoccult Bld/Stl   7. Prostate cancer screening  - PSA  8. Screening for ischemic heart disease  - EKG 12-Lead  9. Screening for AAA (aortic abdominal aneurysm)  - [place], RETROPERITNL ABD,  LTD  10. Fatigue  - Vitamin B12 - Iron and TIBC - Testosterone - CBC with Differential/Platelet  11. Medication management  - CBC with Differential/Platelet - BASIC METABOLIC PANEL WITH GFR - Hepatic function panel - Magnesium - VITAMIN D 25 Hydroxy (V  12. Screening examination for pulmonary tuberculosis  - PPD  13. Family hx of colon cancer       Continue prudent diet as discussed, weight control, BP monitoring, regular exercise, and medications as discussed.  Discussed med effects and SE's. Routine screening labs and tests as requested with regular follow-up as recommended. Over 40 minutes of exam, counseling, chart review and high complex critical decision making was performed

## 2017-01-06 LAB — MICROALBUMIN / CREATININE URINE RATIO
CREATININE, URINE: 183 mg/dL (ref 20–370)
Microalb Creat Ratio: 2 mcg/mg creat (ref <30)
Microalb, Ur: 0.3 mg/dL

## 2017-01-06 LAB — HEMOGLOBIN A1C
Hgb A1c MFr Bld: 5.1 % (ref <5.7)
Mean Plasma Glucose: 100 mg/dL

## 2017-01-06 LAB — HEPATIC FUNCTION PANEL
ALBUMIN: 4 g/dL (ref 3.6–5.1)
ALT: 30 U/L (ref 9–46)
AST: 20 U/L (ref 10–35)
Alkaline Phosphatase: 93 U/L (ref 40–115)
BILIRUBIN TOTAL: 0.5 mg/dL (ref 0.2–1.2)
Bilirubin, Direct: 0.1 mg/dL (ref <=0.2)
Indirect Bilirubin: 0.4 mg/dL (ref 0.2–1.2)
Total Protein: 7.4 g/dL (ref 6.1–8.1)

## 2017-01-06 LAB — BASIC METABOLIC PANEL WITH GFR
BUN: 18 mg/dL (ref 7–25)
CALCIUM: 9.4 mg/dL (ref 8.6–10.3)
CHLORIDE: 103 mmol/L (ref 98–110)
CO2: 21 mmol/L (ref 20–31)
CREATININE: 0.94 mg/dL (ref 0.70–1.33)
GFR, Est African American: >89 mL/min (ref >=60)
GFR, Est Non African American: >89 mL/min (ref >=60)
Glucose, Bld: 100 mg/dL — ABNORMAL HIGH (ref 65–99)
Potassium: 4.4 mmol/L (ref 3.5–5.3)
SODIUM: 141 mmol/L (ref 135–146)

## 2017-01-06 LAB — IRON AND TIBC
%SAT: 29 % (ref 15–60)
Iron: 90 ug/dL (ref 50–180)
TIBC: 314 ug/dL (ref 250–425)
UIBC: 224 ug/dL (ref 125–400)

## 2017-01-06 LAB — TESTOSTERONE: TESTOSTERONE: 122 ng/dL — AB (ref 250–827)

## 2017-01-06 LAB — LIPID PANEL
CHOLESTEROL: 186 mg/dL (ref <200)
HDL: 44 mg/dL (ref >40)
LDL Cholesterol: 120 mg/dL — ABNORMAL HIGH (ref <100)
Total CHOL/HDL Ratio: 4.2 Ratio (ref <5.0)
Triglycerides: 112 mg/dL (ref <150)
VLDL: 22 mg/dL (ref <30)

## 2017-01-06 LAB — MAGNESIUM: Magnesium: 2.3 mg/dL (ref 1.5–2.5)

## 2017-01-06 LAB — INSULIN, RANDOM: INSULIN: 11.2 u[IU]/mL (ref 2.0–19.6)

## 2017-01-06 LAB — VITAMIN D 25 HYDROXY (VIT D DEFICIENCY, FRACTURES): VIT D 25 HYDROXY: 32 ng/mL (ref 30–100)

## 2017-01-07 ENCOUNTER — Encounter: Payer: Self-pay | Admitting: Internal Medicine

## 2017-01-07 LAB — TB SKIN TEST
Induration: 0 mm
TB SKIN TEST: NEGATIVE

## 2017-03-27 ENCOUNTER — Encounter: Payer: Self-pay | Admitting: Internal Medicine

## 2017-04-08 ENCOUNTER — Encounter: Payer: Self-pay | Admitting: Internal Medicine

## 2018-02-03 ENCOUNTER — Ambulatory Visit (INDEPENDENT_AMBULATORY_CARE_PROVIDER_SITE_OTHER): Payer: BLUE CROSS/BLUE SHIELD | Admitting: Internal Medicine

## 2018-02-03 ENCOUNTER — Encounter: Payer: Self-pay | Admitting: Internal Medicine

## 2018-02-03 VITALS — BP 132/80 | HR 84 | Temp 97.5°F | Resp 18 | Ht 75.0 in | Wt 273.4 lb

## 2018-02-03 DIAGNOSIS — R7989 Other specified abnormal findings of blood chemistry: Secondary | ICD-10-CM

## 2018-02-03 DIAGNOSIS — R5383 Other fatigue: Secondary | ICD-10-CM

## 2018-02-03 DIAGNOSIS — R945 Abnormal results of liver function studies: Secondary | ICD-10-CM | POA: Diagnosis not present

## 2018-02-03 DIAGNOSIS — Z0000 Encounter for general adult medical examination without abnormal findings: Secondary | ICD-10-CM | POA: Diagnosis not present

## 2018-02-03 DIAGNOSIS — R7303 Prediabetes: Secondary | ICD-10-CM

## 2018-02-03 DIAGNOSIS — Z1211 Encounter for screening for malignant neoplasm of colon: Secondary | ICD-10-CM

## 2018-02-03 DIAGNOSIS — E782 Mixed hyperlipidemia: Secondary | ICD-10-CM

## 2018-02-03 DIAGNOSIS — Z113 Encounter for screening for infections with a predominantly sexual mode of transmission: Secondary | ICD-10-CM

## 2018-02-03 DIAGNOSIS — K219 Gastro-esophageal reflux disease without esophagitis: Secondary | ICD-10-CM

## 2018-02-03 DIAGNOSIS — Z79899 Other long term (current) drug therapy: Secondary | ICD-10-CM | POA: Diagnosis not present

## 2018-02-03 DIAGNOSIS — R030 Elevated blood-pressure reading, without diagnosis of hypertension: Secondary | ICD-10-CM | POA: Diagnosis not present

## 2018-02-03 DIAGNOSIS — E559 Vitamin D deficiency, unspecified: Secondary | ICD-10-CM

## 2018-02-03 DIAGNOSIS — Z125 Encounter for screening for malignant neoplasm of prostate: Secondary | ICD-10-CM | POA: Diagnosis not present

## 2018-02-03 DIAGNOSIS — Z111 Encounter for screening for respiratory tuberculosis: Secondary | ICD-10-CM

## 2018-02-03 DIAGNOSIS — Z1212 Encounter for screening for malignant neoplasm of rectum: Secondary | ICD-10-CM

## 2018-02-03 DIAGNOSIS — Z136 Encounter for screening for cardiovascular disorders: Secondary | ICD-10-CM | POA: Diagnosis not present

## 2018-02-03 DIAGNOSIS — Z0001 Encounter for general adult medical examination with abnormal findings: Secondary | ICD-10-CM

## 2018-02-03 DIAGNOSIS — E349 Endocrine disorder, unspecified: Secondary | ICD-10-CM | POA: Diagnosis not present

## 2018-02-03 NOTE — Patient Instructions (Signed)

## 2018-02-03 NOTE — Progress Notes (Signed)
[facility]   Unk Pinto, M.D.     Uvaldo Bristle. Silverio Lay, P.A.-C Liane Comber, [place]                [address] [place], [place] [address] Telephone [phone] Telefax [phone] Annual  Screening/Preventative Visit  & Comprehensive Evaluation & Examination     This very nice 57 y.o. MWM presents for a Screening/Preventative Visit & comprehensive evaluation and management of multiple medical co-morbidities.  Patient has been followed for HTN, Prediabetes, Hyperlipidemia and Vitamin D Deficiency. Patient has hx/o GERD controlled with occasional OTC Omeprazole/Ranitidine.          Patient has strong FHx of Colon Cancer in father and paternal grandmother and had Neg Colonoscopies in 2012 and recently in Aug 2017.       Patient ha s hx/o labileHTN predating since Jan 2017. Patient's BP has been controlled at home.  Today's BP: 132/80.  In Aug 2017 , he had a Normal/Negative Cardiolite (EF 71%) Patient denies any cardiac symptoms as chest pain, palpitations, shortness of breath, dizziness or ankle swelling.     Patient's hyperlipidemia is controlled with diet and medications. Patient denies myalgias or other medication SE's. Last lipids were not at goal: Lab Results  Component Value Date   CHOL 186 01/05/2017   HDL 44 01/05/2017   LDLCALC 120 (H) 01/05/2017   TRIG 112 01/05/2017   CHOLHDL 4.2 01/05/2017      Patient has Morbid Obesity (BMI 33+) and  prediabetes (A1c 5.8%/Jan 2017)   and patient denies reactive hypoglycemic symptoms, visual blurring, diabetic polys or paresthesias. Last A1c was Normal & at goal: Lab Results  Component Value Date   HGBA1C 5.1 01/05/2017       Patient has hx/o Low T (171) in Jan 2017 and 122 in Jan 2018.     Finally, patient has history of Vitamin D Deficiency ("26"/Jan 2017)  and last vitamin D was still not at goal: Lab Results  Component Value Date    VD25OH 32 01/05/2017   Current Outpatient Medications on File Prior to Visit  Medication Sig  . Cholecalciferol (VITAMIN D-1000 MAX ST) 1000 units tablet Take 10,000 Units by mouth daily.  . fluticasone (FLONASE) 50 MCG/ACT nasal spray Place into both nostrils as needed for allergies or rhinitis.  Marland Kitchen loratadine (CLARITIN) 10 MG tablet Take 10 mg by mouth every 24 hours as needed for Allergies.  . Multiple Vitamin (MULTI-VITAMINS) TABS Take 1 tablet by mouth daily.  Marland Kitchen omeprazole (PRILOSEC OTC) 20 MG tablet Take 20 mg by mouth every 24 hours as needed (heartburn).   No Known Allergies   Past Medical History:  Diagnosis Date  . Diverticulosis of colon    mild  02/06/2011  . GERD (gastroesophageal reflux disease)   . Varicose veins    Health Maintenance  Topic Date Due  . Hepatitis C Screening  1961-11-26  . HIV Screening  04/04/1975  . INFLUENZA VACCINE  07/14/2017  . COLONOSCOPY  08/21/2021  . TETANUS/TDAP  12/30/2025   Immunization History  Administered Date(s) Administered  . Influenza-Unspecified 09/26/2016, 10/14/2017  . PPD Test 12/31/2015, 01/05/2017, 02/03/2018  . Tdap 12/31/2015   Last Colon -  08/21/2016 - Dr Fuller Plan - recc 5 yr f/u - due 08/2021 Past Surgical History:  Procedure Laterality Date  . BASAL CELL CARCINOMA EXCISION  removed from back,shoulder blade, and lower back  . CHOLECYSTECTOMY    . PILONIDAL CYST EXCISION    . VARICOSE VEIN SURGERY     right leg   Family History  Problem Relation Age of Onset  . Colon cancer Father   . Cancer Father   . Alzheimer's disease Mother   . Colon cancer Paternal Aunt   . Colon cancer Paternal Grandmother   . Coronary artery disease Neg Hx    Social History   Socioeconomic History  . Marital status: Married    Spouse name: Malachy Mood  . Number of children: 2 sons & 2 daughters  Occupational History  . Engineer  Tobacco Use  . Smoking status: Never Smoker  . Smokeless tobacco: Never Used  . Tobacco comment: 10  yrs ago  Substance and Sexual Activity  . Alcohol use: Yes    Alcohol/week: 1.2 - 1.8 oz    Types: 2 - 3 Glasses of wine per week    Comment: 2-3 drinks weekly  . Drug use: No  . Sexual activity: Active    ROS Constitutional: Denies fever, chills, weight loss/gain, headaches, insomnia,  night sweats or change in appetite. Does c/o fatigue. Eyes: Denies redness, blurred vision, diplopia, discharge, itchy or watery eyes.  ENT: Denies discharge, congestion, post nasal drip, epistaxis, sore throat, earache, hearing loss, dental pain, Tinnitus, Vertigo, Sinus pain or snoring.  Cardio: Denies chest pain, palpitations, irregular heartbeat, syncope, dyspnea, diaphoresis, orthopnea, PND, claudication or edema Respiratory: denies cough, dyspnea, DOE, pleurisy, hoarseness, laryngitis or wheezing.  Gastrointestinal: Denies dysphagia, heartburn, reflux, water brash, pain, cramps, nausea, vomiting, bloating, diarrhea, constipation, hematemesis, melena, hematochezia, jaundice or hemorrhoids Genitourinary: Denies dysuria, frequency, urgency, nocturia, hesitancy, discharge, hematuria or flank pain Musculoskeletal: Denies arthralgia, myalgia, stiffness, Jt. Swelling, pain, limp or strain/sprain. Denies Falls. Skin: Denies puritis, rash, hives, warts, acne, eczema or change in skin lesion Neuro: No weakness, tremor, incoordination, spasms, paresthesia or pain Psychiatric: Denies confusion, memory loss or sensory loss. Denies Depression. Endocrine: Denies change in weight, skin, hair change, nocturia, and paresthesia, diabetic polys, visual blurring or hyper / hypo glycemic episodes.  Heme/Lymph: No excessive bleeding, bruising or enlarged lymph nodes.  Physical Exam  BP 132/80   Pulse 84   Temp (!) 97.5 F (36.4 C)   Resp 18   Ht 6\' 3"  (1.905 m)   Wt 273 lb 6.4 oz (124 kg)   BMI 34.17 kg/m   General Appearance: Over nourished and well groomed and in no apparent distress.  Eyes: PERRLA, EOMs,  conjunctiva no swelling or erythema, normal fundi and vessels. Sinuses: No frontal/maxillary tenderness ENT/Mouth: EACs patent / TMs  nl. Nares clear without erythema, swelling, mucoid exudates. Oral hygiene is good. No erythema, swelling, or exudate. Tongue normal, non-obstructing. Tonsils not swollen or erythematous. Hearing normal.  Neck: Supple, thyroid not palpable. No bruits, nodes or JVD. Respiratory: Respiratory effort normal.  BS equal and clear bilateral without rales, rhonci, wheezing or stridor. Cardio: Heart sounds are normal with regular rate and rhythm and no murmurs, rubs or gallops. Peripheral pulses are normal and equal bilaterally without edema. No aortic or femoral bruits. Chest: symmetric with normal excursions and percussion.  Abdomen: Soft, with Nl bowel sounds. Nontender, no guarding, rebound, hernias, masses, or organomegaly.  Lymphatics: Non tender without lymphadenopathy.  Genitourinary: No hernias.Testes nl. DRE - prostate nl for age - smooth & firm w/o nodules. Musculoskeletal: Full ROM all peripheral extremities, joint stability, 5/5 strength, and normal gait. Skin: Warm and  dry without rashes, lesions, cyanosis, clubbing or  ecchymosis.  Neuro: Cranial nerves intact, reflexes equal bilaterally. Normal muscle tone, no cerebellar symptoms. Sensation intact.  Pysch: Alert and oriented X 3 with normal affect, insight and judgment appropriate.   Assessment and Plan  1. Annual Preventative/Screening Exam   2. Elevated BP without diagnosis of hypertension  - EKG 12-Lead - [place], RETROPERITNL ABD,  LTD - Urinalysis, Routine w reflex microscopic - Microalbumin / creatinine urine ratio - CBC with Differential/Platelet - BASIC METABOLIC PANEL WITH GFR - Magnesium - TSH  3. Hyperlipidemia, mixed  - EKG 12-Lead - [place], RETROPERITNL ABD,  LTD - Hepatic function panel - Lipid panel - TSH  4. Prediabetes  - EKG 12-Lead - [place], RETROPERITNL ABD,  LTD - Hemoglobin  A1c - Insulin, random  5. Vitamin D deficiency  - VITAMIN D 25 Hydroxy res)  6. Screening examination for pulmonary tuberculosis  - PPD  7. Testosterone deficiency  - Testosterone  8. Gastroesophageal reflux disease  - CBC with Differential/Platelet  9. Screening for colorectal cancer  - POC Hemoccult Bld/Stl   10. Prostate cancer screening  - PSA  11. Screening for ischemic heart disease  - EKG 12-Lead  12. Screening for AAA (aortic abdominal aneurysm)  - [place], RETROPERITNL ABD,  LTD  13. Abnormal LFTs  - Hepatitis A antibody, total - Hepatitis B core antibody, total - Hepatitis B e antibody - Hepatitis B surface antibody - Hepatitis C antibody  14. Screen for STD (sexually transmitted disease)  - RPR - HIV antibody  15. Fatigue  - Iron,Total/Total Iron Binding Cap - Vitamin B12 - Testosterone - CBC with Differential/Platelet - TSH  16. Medication management  - Urinalysis, Routine w reflex microscopic - Microalbumin / creatinine urine ratio - CBC with Differential/Platelet - BASIC METABOLIC PANEL WITH GFR - Hepatic function panel - Magnesium - Lipid panel - TSH - Hemoglobin A1c - Insulin, random - VITAMIN D 25 Hydroxy          Patient was counseled in prudent diet, weight control to achieve/maintain BMI less than 25, BP monitoring, regular exercise and medications as discussed.  Discussed med effects and SE's. Routine screening labs and tests as requested with regular follow-up as recommended. Over 40 minutes of exam, counseling, chart review and high complex critical decision making was performed

## 2018-02-04 LAB — RPR: RPR Ser Ql: NONREACTIVE

## 2018-02-04 LAB — VITAMIN B12: VITAMIN B 12: 382 pg/mL (ref 200–1100)

## 2018-02-04 LAB — URINALYSIS, ROUTINE W REFLEX MICROSCOPIC
BILIRUBIN URINE: NEGATIVE
GLUCOSE, UA: NEGATIVE
Hgb urine dipstick: NEGATIVE
Ketones, ur: NEGATIVE
Leukocytes, UA: NEGATIVE
Nitrite: NEGATIVE
Protein, ur: NEGATIVE
SPECIFIC GRAVITY, URINE: 1.009 (ref 1.001–1.03)
pH: <=5 (ref 5.0–8.0)

## 2018-02-04 LAB — BASIC METABOLIC PANEL WITH GFR
BUN: 14 mg/dL (ref 7–25)
CO2: 29 mmol/L (ref 20–32)
CREATININE: 0.9 mg/dL (ref 0.70–1.33)
Calcium: 9.6 mg/dL (ref 8.6–10.3)
Chloride: 101 mmol/L (ref 98–110)
GFR, EST AFRICAN AMERICAN: 109 mL/min/{1.73_m2} (ref >=60)
GFR, Est Non African American: 94 mL/min/{1.73_m2} (ref >=60)
GLUCOSE: 96 mg/dL (ref 65–99)
Potassium: 4.5 mmol/L (ref 3.5–5.3)
SODIUM: 141 mmol/L (ref 135–146)

## 2018-02-04 LAB — HEPATIC FUNCTION PANEL
AG RATIO: 1.3 (calc) (ref 1.0–2.5)
ALKALINE PHOSPHATASE (APISO): 110 U/L (ref 40–115)
ALT: 21 U/L (ref 9–46)
AST: 15 U/L (ref 10–35)
Albumin: 4.3 g/dL (ref 3.6–5.1)
BILIRUBIN DIRECT: 0.1 mg/dL (ref 0.0–0.2)
BILIRUBIN INDIRECT: 0.4 mg/dL (ref 0.2–1.2)
BILIRUBIN TOTAL: 0.5 mg/dL (ref 0.2–1.2)
Globulin: 3.4 g/dL (calc) (ref 1.9–3.7)
TOTAL PROTEIN: 7.7 g/dL (ref 6.1–8.1)

## 2018-02-04 LAB — LIPID PANEL
CHOLESTEROL: 207 mg/dL — AB (ref <200)
HDL: 51 mg/dL (ref >40)
LDL CHOLESTEROL (CALC): 128 mg/dL — AB
Non-HDL Cholesterol (Calc): 156 mg/dL (calc) — ABNORMAL HIGH (ref <130)
Total CHOL/HDL Ratio: 4.1 (calc) (ref <5.0)
Triglycerides: 162 mg/dL — ABNORMAL HIGH (ref <150)

## 2018-02-04 LAB — HEPATITIS A ANTIBODY, TOTAL: HEPATITIS A AB,TOTAL: NONREACTIVE

## 2018-02-04 LAB — CBC WITH DIFFERENTIAL/PLATELET
Basophils Absolute: 57 cells/uL (ref 0–200)
Basophils Relative: 0.6 %
EOS ABS: 171 {cells}/uL (ref 15–500)
Eosinophils Relative: 1.8 %
HCT: 44.2 % (ref 38.5–50.0)
HEMOGLOBIN: 14.6 g/dL (ref 13.2–17.1)
Lymphs Abs: 2090 cells/uL (ref 850–3900)
MCH: 28.5 pg (ref 27.0–33.0)
MCHC: 33 g/dL (ref 32.0–36.0)
MCV: 86.3 fL (ref 80.0–100.0)
MONOS PCT: 5.8 %
MPV: 10.1 fL (ref 7.5–12.5)
NEUTROS ABS: 6631 {cells}/uL (ref 1500–7800)
Neutrophils Relative %: 69.8 %
Platelets: 378 10*3/uL (ref 140–400)
RBC: 5.12 10*6/uL (ref 4.20–5.80)
RDW: 13 % (ref 11.0–15.0)
TOTAL LYMPHOCYTE: 22 %
WBC mixed population: 551 cells/uL (ref 200–950)
WBC: 9.5 10*3/uL (ref 3.8–10.8)

## 2018-02-04 LAB — MAGNESIUM: Magnesium: 2.3 mg/dL (ref 1.5–2.5)

## 2018-02-04 LAB — MICROALBUMIN / CREATININE URINE RATIO
Creatinine, Urine: 53 mg/dL (ref 20–320)
Microalb Creat Ratio: 6 mcg/mg creat (ref <30)
Microalb, Ur: 0.3 mg/dL

## 2018-02-04 LAB — VITAMIN D 25 HYDROXY (VIT D DEFICIENCY, FRACTURES): VIT D 25 HYDROXY: 30 ng/mL (ref 30–100)

## 2018-02-04 LAB — IRON, TOTAL/TOTAL IRON BINDING CAP
%SAT: 27 % (calc) (ref 15–60)
IRON: 87 ug/dL (ref 50–180)
TIBC: 325 ug/dL (ref 250–425)

## 2018-02-04 LAB — HEMOGLOBIN A1C
EAG (MMOL/L): 6.2 (calc)
Hgb A1c MFr Bld: 5.5 % of total Hgb (ref <5.7)
Mean Plasma Glucose: 111 (calc)

## 2018-02-04 LAB — HEPATITIS C ANTIBODY
Hepatitis C Ab: NONREACTIVE
SIGNAL TO CUT-OFF: 0.05 (ref <1.00)

## 2018-02-04 LAB — PSA: PSA: 1.6 ng/mL (ref <=4.0)

## 2018-02-04 LAB — TSH: TSH: 2.35 mIU/L (ref 0.40–4.50)

## 2018-02-04 LAB — HEPATITIS B E ANTIBODY: Hep B E Ab: NONREACTIVE

## 2018-02-04 LAB — TESTOSTERONE: Testosterone: 173 ng/dL — ABNORMAL LOW (ref 250–827)

## 2018-02-04 LAB — HEPATITIS B CORE ANTIBODY, TOTAL: Hep B Core Total Ab: NONREACTIVE

## 2018-02-04 LAB — HIV ANTIBODY (ROUTINE TESTING W REFLEX): HIV 1&2 Ab, 4th Generation: NONREACTIVE

## 2018-02-04 LAB — HEPATITIS B SURFACE ANTIBODY,QUALITATIVE: Hep B S Ab: NONREACTIVE

## 2018-02-04 LAB — INSULIN, RANDOM: INSULIN: 11 u[IU]/mL (ref 2.0–19.6)

## 2018-03-30 ENCOUNTER — Ambulatory Visit (INDEPENDENT_AMBULATORY_CARE_PROVIDER_SITE_OTHER): Payer: BLUE CROSS/BLUE SHIELD | Admitting: Adult Health

## 2018-03-30 ENCOUNTER — Encounter: Payer: Self-pay | Admitting: Adult Health

## 2018-03-30 VITALS — BP 136/86 | HR 80 | Temp 97.9°F | Ht 75.0 in | Wt 278.0 lb

## 2018-03-30 DIAGNOSIS — M62830 Muscle spasm of back: Secondary | ICD-10-CM

## 2018-03-30 DIAGNOSIS — M545 Low back pain, unspecified: Secondary | ICD-10-CM

## 2018-03-30 MED ORDER — PREDNISONE 20 MG PO TABS: ORAL_TABLET | ORAL | 0 refills | Status: DC

## 2018-03-30 MED ORDER — CYCLOBENZAPRINE HCL 10 MG PO TABS: ORAL_TABLET | ORAL | 0 refills | Status: DC

## 2018-03-30 MED ORDER — MELOXICAM 15 MG PO TABS: ORAL_TABLET | ORAL | 1 refills | Status: DC

## 2018-03-30 NOTE — Patient Instructions (Signed)
Try the exercises and other information in the back care manual, meloxicam once during the day as needed (avoid taking other NSAIDS like Alleve or Ibuprofen while taking this) and then flexeril if needed at bedtime for muscle spasm. This can be taken up to every 8 hours, but causes sedation, so should not drive or operate heavy machinery while taking this medicine.   Go to the ER if you have any new weakness in your legs, have trouble controlling your urine or bowels, or have worsening pain.   If you are not better in 1 month we can refer to physical therapy   Back pain Rehab Ask your health care provider which exercises are safe for you. Do exercises exactly as told by your health care provider and adjust them as directed. It is normal to feel mild stretching, pulling, tightness, or discomfort as you do these exercises, but you should stop right away if you feel sudden pain or your pain gets worse.Do not begin these exercises until told by your health care provider. Stretching and range of motion exercises These exercises warm up your muscles and joints and improve the movement and flexibility of your hips and your back. These exercises also help to relieve pain, numbness, and tingling. Exercise A: Sciatic nerve glide 1. Sit in a chair with your head facing down toward your chest. Place your hands behind your back. Let your shoulders slump forward. 2. Slowly straighten one of your knees while you tilt your head back as if you are looking toward the ceiling. Only straighten your leg as far as you can without making your symptoms worse. 3. Hold for __________ seconds. 4. Slowly return to the starting position. 5. Repeat with your other leg. Repeat __________ times. Complete this exercise __________ times a day. Exercise B: Knee to chest with hip adduction and internal rotation  1. Lie on your back on a firm surface with both legs straight. 2. Bend one of your knees and move it up toward your  chest until you feel a gentle stretch in your lower back and buttock. Then, move your knee toward the shoulder that is on the opposite side from your leg. ? Hold your leg in this position by holding onto the front of your knee. 3. Hold for __________ seconds. 4. Slowly return to the starting position. 5. Repeat with your other leg. Repeat __________ times. Complete this exercise __________ times a day. Exercise C: Prone extension on elbows  1. Lie on your abdomen on a firm surface. A bed may be too soft for this exercise. 2. Prop yourself up on your elbows. 3. Use your arms to help lift your chest up until you feel a gentle stretch in your abdomen and your lower back. ? This will place some of your body weight on your elbows. If this is uncomfortable, try stacking pillows under your chest. ? Your hips should stay down, against the surface that you are lying on. Keep your hip and back muscles relaxed. 4. Hold for __________ seconds. 5. Slowly relax your upper body and return to the starting position. Repeat __________ times. Complete this exercise __________ times a day. Strengthening exercises These exercises build strength and endurance in your back. Endurance is the ability to use your muscles for a long time, even after they get tired. Exercise D: Pelvic tilt 1. Lie on your back on a firm surface. Bend your knees and keep your feet flat. 2. Tense your abdominal muscles. Tip your pelvis up  toward the ceiling and flatten your lower back into the floor. ? To help with this exercise, you may place a small towel under your lower back and try to push your back into the towel. 3. Hold for __________ seconds. 4. Let your muscles relax completely before you repeat this exercise. Repeat __________ times. Complete this exercise __________ times a day. Exercise E: Alternating arm and leg raises  1. Get on your hands and knees on a firm surface. If you are on a hard floor, you may want to use  padding to cushion your knees, such as an exercise mat. 2. Line up your arms and legs. Your hands should be below your shoulders, and your knees should be below your hips. 3. Lift your left leg behind you. At the same time, raise your right arm and straighten it in front of you. ? Do not lift your leg higher than your hip. ? Do not lift your arm higher than your shoulder. ? Keep your abdominal and back muscles tight. ? Keep your hips facing the ground. ? Do not arch your back. ? Keep your balance carefully, and do not hold your breath. 4. Hold for __________ seconds. 5. Slowly return to the starting position and repeat with your right leg and your left arm. Repeat __________ times. Complete this exercise __________ times a day. Posture and body mechanics  Body mechanics refers to the movements and positions of your body while you do your daily activities. Posture is part of body mechanics. Good posture and healthy body mechanics can help to relieve stress in your body's tissues and joints. Good posture means that your spine is in its natural S-curve position (your spine is neutral), your shoulders are pulled back slightly, and your head is not tipped forward. The following are general guidelines for applying improved posture and body mechanics to your everyday activities. Standing   When standing, keep your spine neutral and your feet about hip-width apart. Keep a slight bend in your knees. Your ears, shoulders, and hips should line up.  When you do a task in which you stand in one place for a long time, place one foot up on a stable object that is 2-4 inches (5-10 cm) high, such as a footstool. This helps keep your spine neutral. Sitting   When sitting, keep your spine neutral and keep your feet flat on the floor. Use a footrest, if necessary, and keep your thighs parallel to the floor. Avoid rounding your shoulders, and avoid tilting your head forward.  When working at a desk or a  computer, keep your desk at a height where your hands are slightly lower than your elbows. Slide your chair under your desk so you are close enough to maintain good posture.  When working at a computer, place your monitor at a height where you are looking straight ahead and you do not have to tilt your head forward or downward to look at the screen. Resting   When lying down and resting, avoid positions that are most painful for you.  If you have pain with activities such as sitting, bending, stooping, or squatting (flexion-based activities), lie in a position in which your body does not bend very much. For example, avoid curling up on your side with your arms and knees near your chest (fetal position).  If you have pain with activities such as standing for a long time or reaching with your arms (extension-based activities), lie with your spine in a neutral  position and bend your knees slightly. Try the following positions: ? Lying on your side with a pillow between your knees. ? Lying on your back with a pillow under your knees. Lifting   When lifting objects, keep your feet at least shoulder-width apart and tighten your abdominal muscles.  Bend your knees and hips and keep your spine neutral. It is important to lift using the strength of your legs, not your back. Do not lock your knees straight out.  Always ask for help to lift heavy or awkward objects. This information is not intended to replace advice given to you by your health care provider. Make sure you discuss any questions you have with your health care provider. Document Released: 11/30/2005 Document Revised: 08/06/2016 Document Reviewed: 08/16/2015 Elsevier Interactive Patient Education  [employer].

## 2018-03-30 NOTE — Progress Notes (Signed)
Assessment and Plan:  Blake Johnson was seen today for back pain.  Diagnoses and all orders for this visit:  Lumbar pain/Spasm of muscle of lower back - negative straight leg, presentation suggestive of simple lumbar muscle spasm, patient reassured NSAIDs, muscle relaxer, continue with heat, recommended gentle stretching and lumbar support with extended sitting, evaluate mattress for need to update, and exercises given and demonstrated If not better follow up in office or will refer to PT -     meloxicam (MOBIC) 15 MG tablet; Take one daily with food for 2 weeks, can take with tylenol, can not take with aleve, iburpofen, then as needed daily for pain -     cyclobenzaprine (FLEXERIL) 10 MG tablet; Take 0.5-1 tab three times a day as needed, discussed to start slow and avoid driving while taking this medication if makes very sleepy, do not take with alcohol  Can fill and try short steroid taper if not fully resolving with NSAID and muscle relaxer:        -     predniSONE (DELTASONE) 20 MG tablet; 2 tablets daily for 3 days, 1 tablet daily for 4 days.  Further disposition pending results of labs. Discussed med's effects and SE's.   Over 15 minutes of exam, counseling, chart review, and critical decision making was performed.   Future Appointments  Date Time Provider [place]  02/22/2019  9:00 AM Unk Pinto, MD [facility] None    ------------------------------------------------------------------------------------------------------------------   HPI BP 136/86   Pulse 80   Temp 97.9 F (36.6 C)   Ht 6\' 3"  (1.905 m)   Wt 278 lb (126.1 kg)   SpO2 98%   BMI 34.75 kg/m   56 y.o.male with a benign medical history presents for evaluation of new left lumbar pain; He reports no injury or hx of recurrent lumbar pain, states pain has been worse in the morning, has been intermittent over the last 2 weeks, but seems to be worsening, and is worse with rotation of the waist, long periods of  sitting. He reports pain is sharp, stabbing, 8/10 when "bad," but depending on positioning will fully resolve. He reports symptoms are improved with heat application, has tried tylenol/ibuprofen as well with some improvement.   He is very concerned that this may be "kidney related" or "colon cancer" due to strong family hx; he has no personal hx of malignancy, and on review has negative colonoscopies in 2012 and in 07/2016. He denies any constipation, stool character change, weight loss, fatigue, hematochezia, melena, urinary changes. Reassured patient that symptoms are very typical of musculoskeletal origin.   Past Medical History:  Diagnosis Date  . Diverticulosis of colon    mild  02/06/2011  . GERD (gastroesophageal reflux disease)   . Varicose veins      No Known Allergies  Current Outpatient Medications on File Prior to Visit  Medication Sig  . aspirin EC 81 MG tablet Take 81 mg by mouth daily.  . Cholecalciferol (VITAMIN D PO) Take 5,000 Units by mouth 2 (two) times daily.  . Cyanocobalamin (VITAMIN B12 PO) Take by mouth.  . fluticasone (FLONASE) 50 MCG/ACT nasal spray Place into both nostrils as needed for allergies or rhinitis.  Marland Kitchen loratadine (CLARITIN) 10 MG tablet Take 10 mg by mouth every 24 hours as needed for Allergies.  . Multiple Vitamin (MULTI-VITAMINS) TABS Take 1 tablet by mouth daily.  . Multiple Vitamins-Minerals (ZINC PO) Take by mouth.  . ranitidine (ZANTAC) 75 MG tablet Take 75 mg by mouth  2 (two) times daily. Takes PRN  . omeprazole (PRILOSEC OTC) 20 MG tablet Take 20 mg by mouth every 24 hours as needed (heartburn).   No current facility-administered medications on file prior to visit.     ROS: Review of Systems  Constitutional: Negative for chills, diaphoresis, fever, malaise/fatigue and weight loss.  Respiratory: Negative for shortness of breath.   Cardiovascular: Negative for chest pain and palpitations.  Gastrointestinal: Negative for abdominal pain, blood  in stool, constipation, diarrhea, melena, nausea and vomiting.  Genitourinary: Negative for dysuria, flank pain, frequency, hematuria and urgency.  Musculoskeletal: Positive for back pain. Negative for falls, joint pain, myalgias and neck pain.  Neurological: Negative for dizziness and headaches.    Physical Exam:  BP 136/86   Pulse 80   Temp 97.9 F (36.6 C)   Ht 6\' 3"  (1.905 m)   Wt 278 lb (126.1 kg)   SpO2 98%   BMI 34.75 kg/m   General Appearance: Well nourished, in no apparent distress. Neck: Supple.  Respiratory: Respiratory effort normal, BS equal bilaterally without rales, rhonchi, wheezing or stridor.  Cardio: RRR with no MRGs. Brisk peripheral pulses without edema.  Abdomen: Soft, + BS.  Non tender, no guarding, rebound, hernias, masses.  Lymphatics: Non tender without lymphadenopathy.  Musculoskeletal: Full ROM (some pain with rotation of lumbar spine, non-radiating), 5/5 extremity strength, normal gait, negative straight leg raise, some tenderness to palpation of left lumbar musculature, no CVA tenderness or bony spinous tenderness.  Neuro:  Sensation intact, lower extremity reflexes symmetrical and intact.  Psych: Awake and oriented X 3, normal affect, Insight and Judgment appropriate.    Blake Ribas, NP 4:13 PM [facility]

## 2018-10-18 ENCOUNTER — Ambulatory Visit (INDEPENDENT_AMBULATORY_CARE_PROVIDER_SITE_OTHER): Payer: BLUE CROSS/BLUE SHIELD | Admitting: Internal Medicine

## 2018-10-18 ENCOUNTER — Encounter: Payer: Self-pay | Admitting: Internal Medicine

## 2018-10-18 VITALS — BP 134/82 | HR 85 | Temp 97.5°F | Resp 16 | Ht 75.0 in | Wt 270.0 lb

## 2018-10-18 DIAGNOSIS — J40 Bronchitis, not specified as acute or chronic: Secondary | ICD-10-CM

## 2018-10-18 DIAGNOSIS — G250 Essential tremor: Secondary | ICD-10-CM | POA: Diagnosis not present

## 2018-10-18 MED ORDER — PREDNISONE 20 MG PO TABS: ORAL_TABLET | ORAL | 0 refills | Status: DC

## 2018-10-18 MED ORDER — AZITHROMYCIN 250 MG PO TABS: ORAL_TABLET | ORAL | 1 refills | Status: DC

## 2018-10-18 NOTE — Patient Instructions (Addendum)
b Acute Bronchitis, Adult Acute bronchitis is when air tubes (bronchi) in the lungs suddenly get swollen. The condition can make it hard to breathe. It can also cause these symptoms:  A cough.  Coughing up clear, yellow, or green mucus.  Wheezing.  Chest congestion.  Shortness of breath.  A fever.  Body aches.  Chills.  A sore throat.  Follow these instructions at home: Medicines  Take over-the-counter and prescription medicines only as told by your doctor.  If you were prescribed an antibiotic medicine, take it as told by your doctor. Do not stop taking the antibiotic even if you start to feel better. General instructions  Rest.  Drink enough fluids to keep your pee (urine) clear or pale yellow.  Avoid smoking and secondhand smoke. If you smoke and you need help quitting, ask your doctor. Quitting will help your lungs heal faster.  Use an inhaler, cool mist vaporizer, or humidifier as told by your doctor.  Keep all follow-up visits as told by your doctor. This is important. How is this prevented? To lower your risk of getting this condition again:  Wash your hands often with soap and water. If you cannot use soap and water, use hand sanitizer.  Avoid contact with people who have cold symptoms.  Try not to touch your hands to your mouth, nose, or eyes.  Make sure to get the flu shot every year.  Contact a doctor if:  Your symptoms do not get better in 2 weeks. Get help right away if:  You cough up blood.  You have chest pain.  You have very bad shortness of breath.  You become dehydrated.  You faint (pass out) or keep feeling like you are going to pass out.  You keep throwing up (vomiting).  You have a very bad headache.  Your fever or chills gets worse. ++++++++++++++++++++++++++++++++  Essential Tremor A tremor is trembling or shaking that you cannot control. Most tremors affect the hands or arms. Tremors can also affect the head, vocal  cords, face, and other parts of the body. Essential tremor is a tremor without a known cause. What are the causes? Essential tremor has no known cause. What increases the risk? You may be at greater risk of essential tremor if:  You have a family member with essential tremor.  You are age 58 or older.  You take certain medicines.  What are the signs or symptoms? The main sign of a tremor is uncontrolled and unintentional rhythmic shaking of a body part.  You may have difficulty eating with a spoon or fork.  You may have difficulty writing.  You may nod your head up and down or side to side.  You may have a quivering voice.  Your tremors:  May get worse over time.  May come and go.  May be more noticeable on one side of your body.  May get worse due to stress, fatigue, caffeine, and extreme heat or cold.  How is this diagnosed? Your health care provider can diagnose essential tremor based on your symptoms, medical history, and a physical examination. There is no single test to diagnose an essential tremor. However, your health care provider may perform a variety of tests to rule out other conditions. Tests may include:  Blood and urine tests.  Imaging studies of your brain, such as: ? CT scan. ? MRI.  A test that measures involuntary muscle movement (electromyogram).  How is this treated? Your tremors may go away without treatment.  Mild tremors may not need treatment if they do not affect your day-to-day life. Severe tremors may need to be treated using one or a combination of the following options:  Medicines. This may include medicine that is injected.  Lifestyle changes.  Physical therapy.  Follow these instructions at home:  Take medicines only as directed by your health care provider.  Limit alcohol intake to no more than 1 drink per day for nonpregnant women and 2 drinks per day for men. One drink equals 12 oz of beer, 5 oz of wine, or 1 oz of hard  liquor.  Do not use any tobacco products, including cigarettes, chewing tobacco, or electronic cigarettes. If you need help quitting, ask your health care provider.  Take medicines only as directed by your health care provider.  Avoid extreme heat or cold.  Limit the amount of caffeine you consumeas directed by your health care provider.  Try to get eight hours of sleep each night.  Find ways to manage your stress, such as meditation or yoga.  Keep all follow-up visits as directed by your health care provider. This is important. This includes any physical therapy visits. Contact a health care provider if:  You experience any changes in the location or intensity of your tremors.  You start having a tremor after starting a new medicine.  You have tremor with other symptoms such as: ? Numbness. ? Tingling. ? Pain. ? Weakness.  Your tremor gets worse.  Your tremor interferes with your daily life.

## 2018-10-18 NOTE — Progress Notes (Signed)
  Subjective:    Patient ID: Blake Johnson, male    DOB: 04/19/1961, 57 y.o.   MRN: [id number]  HPI   This very nice 57 yo MWM presents with c/o chest congestion & productive cough. Sputum is yellow green. Denies  dyspnea.     At last OV his Vit D  - 30 - was low as was his B12 (382) and Testosterone (173) . He was advised & reminded to Take Vit D 10,000 units, a Super B complex and Zinc 50 mg.     He also expressed concerns regarding a fine tre,mor of his hand with action. Denied k/o FHx of tremor/palsy, etc.  Medication Sig  . aspirin EC 81 MG tablet Take 81 mg by mouth daily.  . Cholecalciferol (VITAMIN D PO) Take 5,000 Units by mouth 2 (two) times daily.  . Cyanocobalamin (VITAMIN B12 PO) Take by mouth.  . cyclobenzaprine (FLEXERIL) 10 MG tablet Take 0.5-1 tab three times a day as needed  . meloxicam (MOBIC) 15 MG tablet Take one daily with food for 2 weeks, can take with tylenol, can not take with aleve, iburpofen, then as needed daily for pain  . Multiple Vitamin (MULTI-VITAMINS) TABS Take 1 tablet by mouth daily.  . Multiple Vitamins-Minerals (ZINC PO) Take by mouth.  . ranitidine (ZANTAC) 75 MG tablet Take 75 mg by mouth 2 (two) times daily. Takes PRN  . fluticasone (FLONASE) 50 MCG/ACT nasal spray Place into both nostrils as needed for allergies or rhinitis.  Marland Kitchen loratadine (CLARITIN) 10 MG tablet Take 10 mg by mouth every 24 hours as needed for Allergies.  Marland Kitchen omeprazole (PRILOSEC OTC) 20 MG tablet Take 20 mg by mouth every 24 hours as needed (heartburn).   No Known Allergies   Past Medical History:  Diagnosis Date  . Diverticulosis of colon    mild  02/06/2011  . GERD (gastroesophageal reflux disease)   . Varicose veins    Review of Systems    10 point systems review negative except as above.     Objective:   Physical Exam  BP 134/82   Pulse 85   Temp (!) 97.5 F (36.4 C)   Resp 16   Ht 6\' 3"  (1.905 m)   Wt 270 lb (122.5 kg)   SpO2 95%   BMI 33.75 kg/m    Congested cough. No stridor  HEENT - WNL. Neck - supple.  Chest -  Bilat scattered ins rales & exp rhonchi . No wheezes. Cor - Nl HS. RRR w/o sig m. PP 1(+). No edema. MS- FROM w/o deformities.  Gait Nl. Neuro -  Nl w/o focal abnormalities exsent fine low amplitude hifreq tremor of hands Lt>Rt.     Assessment & Plan:   1. Bronchitis  - predniSONE (DELTASONE) 20 MG tablet; 1 tab 3 x day for 3 days, then 1 tab 2 x day for 3 days, then 1 tab 1 x day for 5 days  Dispense: 20 tablet; Refill: 0 - azithromycin (ZITHROMAX) 250 MG tablet; Take 2 tablets (500 mg) on  Day 1,  followed by 1 tablet (250 mg) once daily on Days 2 through 5.  Dispense: 6 each; Refill: 1  2. Hereditary essential tremor  - Discussed various options - betablocker, diazepam, etc & he defers treatment at present. -  Recommended avoid caffeine & decongestants

## 2019-02-22 ENCOUNTER — Encounter: Payer: Self-pay | Admitting: Internal Medicine

## 2019-04-11 ENCOUNTER — Encounter: Payer: Self-pay | Admitting: Internal Medicine

## 2019-04-11 NOTE — Progress Notes (Signed)
[facility]   Unk Pinto, M.D.     Blake Johnson. Silverio Lay, P.A.-C Liane Comber, [place]                [address] [place] [address] Telephone [phone] Telefax [phone] Annual  Screening/Preventative Visit  & Comprehensive Evaluation & Examination  History of Present Illness:     This very nice 58 y.o. MWM  presents for a Screening /Preventative Visit & comprehensive evaluation and management of multiple medical co-morbidities.  Patient has been followed for HTN, HLD, Prediabetes and Vitamin D Deficiency. Patient had GERD controlled with prudent diet & his meds taking ranitidine occasionally am=nd reporting occasional am hoarseness and raspy voice suspect for gastrolaryngeal reflux.     Patient has hx/o Labile HTN followed expectantly since Jan 2017. Patient's BP has been controlled at home.  Today's BP is at goal - 126/88. Patient had a Negative Cardiolite (EF 71%) in Aug 2017. Patient denies any cardiac symptoms as chest pain, palpitations, shortness of breath, dizziness or ankle swelling.     Patient's hyperlipidemia is controlled with diet.  Lab Results  Component Value Date   CHOL 207 (H) 02/03/2018   HDL 51 02/03/2018   LDLCALC 128 (H) 02/03/2018   TRIG 162 (H) 02/03/2018   CHOLHDL 4.1 02/03/2018      Patient has Morbid Obesity (BMI 34+) and hx/o prediabetes  (A1c 5.8% / Jan 2017)  and patient denies reactive hypoglycemic symptoms, visual blurring, diabetic polys or paresthesias. Last A1c was at goal: Lab Results  Component Value Date   HGBA1C 5.5 02/03/2018        Patient has hx/o Low Testosterone ("171" / Jan 2017 - "122" / Jan 2018 and "173" / Feb 2019). He das deferred replacement therapy. He was advised starting Zinc last year.     Finally, patient has history of Vitamin D Deficiency ("45" / Jan 2017) and last vitamin D was still very low: Lab Results   Component Value Date   VD25OH 30 02/03/2018   Current Outpatient Medications on File Prior to Visit  Medication Sig  . aspirin EC 81 MG tablet Take 81 mg by mouth daily.  . Cholecalciferol (VITAMIN D PO) Take 5,000 Units by mouth 2 (two) times daily.  . Multiple Vitamin (MULTI-VITAMINS) TABS Take 1 tablet by mouth daily.  . Multiple Vitamins-Minerals (ZINC PO) Take by mouth.  . ranitidine (ZANTAC) 75 MG tablet Take 75 mg by mouth 2 (two) times daily. Takes PRN   No current facility-administered medications on file prior to visit.    No Known Allergies Past Medical History:  Diagnosis Date  . Diverticulosis of colon    mild  02/06/2011  . GERD (gastroesophageal reflux disease)   . Varicose veins    Health Maintenance  Topic Date Due  . INFLUENZA VACCINE  07/15/2019  . COLONOSCOPY  08/21/2021  . TETANUS/TDAP  12/30/2025  . Hepatitis C Screening  Completed  . HIV Screening  Completed   Immunization History  Administered Date(s) Administered  . Influenza-Unspecified 09/26/2016, 10/14/2017  . PPD Test 12/31/2015, 01/05/2017, 02/03/2018  . Tdap 12/31/2015   Last Colon - 08/21/2016 - Dr Fuller Plan - recommended 5 yr f/u - due Sept 2022  Past Surgical History:  Procedure Laterality Date  . BASAL CELL CARCINOMA EXCISION     removed from back,shoulder blade, and lower back  .  CHOLECYSTECTOMY    . PILONIDAL CYST EXCISION    . VARICOSE VEIN SURGERY     right leg   Family History  Problem Relation Age of Onset  . Colon cancer Father   . Cancer Father   . Alzheimer's disease Mother   . Colon cancer Paternal Aunt   . Colon cancer Paternal Grandmother   . Coronary artery disease Neg Hx    Social History   Socioeconomic History  . Marital status: Married    Spouse name: Malachy Mood  . Number of children: 2 sons & 2 daughters  Occupational History  . Engineer  Tobacco Use  . Smoking status: Never Smoker  . Smokeless tobacco: Never Used  . Tobacco comment: 10 yrs ago  Substance  and Sexual Activity  . Alcohol use: Yes    Alcohol/week: 2.0 - 3.0 standard drinks    Types: 2 - 3 Glasses of wine per week    Comment: 2-3 drinks weekly  . Drug use: No  . Sexual activity: Not on file    ROS Constitutional: Denies fever, chills, weight loss/gain, headaches, insomnia,  night sweats or change in appetite. Does c/o fatigue. Eyes: Denies redness, blurred vision, diplopia, discharge, itchy or watery eyes.  ENT: Denies discharge, congestion, post nasal drip, epistaxis, sore throat, earache, hearing loss, dental pain, Tinnitus, Vertigo, Sinus pain or snoring.  Cardio: Denies chest pain, palpitations, irregular heartbeat, syncope, dyspnea, diaphoresis, orthopnea, PND, claudication or edema Respiratory: denies cough, dyspnea, DOE, pleurisy, hoarseness, laryngitis or wheezing.  Gastrointestinal: Denies dysphagia, heartburn, reflux, water brash, pain, cramps, nausea, vomiting, bloating, diarrhea, constipation, hematemesis, melena, hematochezia, jaundice or hemorrhoids Genitourinary: Denies dysuria, frequency, urgency, nocturia, hesitancy, discharge, hematuria or flank pain Musculoskeletal: Denies arthralgia, myalgia, stiffness, Jt. Swelling, pain, limp or strain/sprain. Denies Falls. Skin: Denies puritis, rash, hives, warts, acne, eczema or change in skin lesion Neuro: No weakness, tremor, incoordination, spasms, paresthesia or pain Psychiatric: Denies confusion, memory loss or sensory loss. Denies Depression. Endocrine: Denies change in weight, skin, hair change, nocturia, and paresthesia, diabetic polys, visual blurring or hyper / hypo glycemic episodes.  Heme/Lymph: No excessive bleeding, bruising or enlarged lymph nodes.  Physical Exam  BP 126/88   Pulse 76   Temp (!) 97 F (36.1 C)   Resp 18   Ht 6\' 3"  (1.905 m)   Wt 272 lb 9.6 oz (123.7 kg)   BMI 34.07 kg/m   General Appearance: Over nourished and well groomed and in no apparent distress.  Eyes: PERRLA, EOMs,  conjunctiva no swelling or erythema, normal fundi and vessels. Sinuses: No frontal/maxillary tenderness ENT/Mouth: EACs patent / TMs  nl. Nares clear without erythema, swelling, mucoid exudates. Oral hygiene is good. No erythema, swelling, or exudate. Tongue normal, non-obstructing. Tonsils not swollen or erythematous. Hearing normal.  Neck: Supple, thyroid not palpable. No bruits, nodes or JVD. Respiratory: Respiratory effort normal.  BS equal and clear bilateral without rales, rhonci, wheezing or stridor. Cardio: Heart sounds are normal with regular rate and rhythm and no murmurs, rubs or gallops. Peripheral pulses are normal and equal bilaterally without edema. No aortic or femoral bruits. Chest: symmetric with normal excursions and percussion.  Abdomen: Soft, with Nl bowel sounds. Nontender, no guarding, rebound, hernias, masses, or organomegaly.  Lymphatics: Non tender without lymphadenopathy.  Musculoskeletal: Full ROM all peripheral extremities, joint stability, 5/5 strength, and normal gait. Skin: Warm and dry without rashes, lesions, cyanosis, clubbing or  ecchymosis.  Neuro: Cranial nerves intact, reflexes equal bilaterally. Normal muscle tone, no cerebellar  symptoms. Sensation intact.  Pysch: Alert and oriented X 3 with normal affect, insight and judgment appropriate.   Assessment and Plan  1. Annual Preventative/Screening Exam   2. Labile hypertension  - EKG 12-Lead - [place], RETROPERITNL ABD,  LTD - Urinalysis, Routine w reflex microscopic - Microalbumin / creatinine urine ratio - CBC with Differential/Platelet - COMPLETE METABOLIC PANEL WITH GFR - Magnesium - TSH  3. Hyperlipidemia, mixed  - EKG 12-Lead - [place], RETROPERITNL ABD,  LTD - Lipid panel - TSH  4. Abnormal glucose  - EKG 12-Lead - [place], RETROPERITNL ABD,  LTD - Hemoglobin A1c - Insulin, random  5. Vitamin D deficiency  - VITAMIN D 25 Hydroxyl  6. Prediabetes  - EKG 12-Lead - [place], RETROPERITNL ABD,   LTD - Hemoglobin A1c - Insulin, random  7. Testosterone deficiency  - Testosterone  8. Gastroesophageal reflux disease  - CBC with Differential/Platelet - famotidine 40 MG tablet; Take 1 tablet at bedtime for Acid Reflux  Dispense: 90 tablet; Refill: 3  9. Fatigue, unspecified type  - Iron,Total/Total Iron Binding Cap - Vitamin B12 - Testosterone  10. Screening examination for pulmonary tuberculosis  - TB Skin Test  11. Screening for colorectal cancer  - POC Hemoccult Bld/Stl  12. Prostate cancer screening  - PSA  13. Screening for ischemic heart disease  - EKG 12-Lead  14. Screening for AAA (aortic abdominal aneurysm)  - [place], RETROPERITNL ABD,  LTD  15. Medication management  - Urinalysis, Routine w reflex microscopic - Microalbumin / creatinine urine ratio - Testosterone - CBC with Differential/Platelet - COMPLETE METABOLIC PANEL WITH GFR - Magnesium - Lipid panel - TSH - Hemoglobin A1c - Insulin, random - VITAMIN D 25 Hydroxyl        Patient was counseled in prudent diet, weight control to achieve/maintain BMI less than 25, BP monitoring, regular exercise and medications as discussed.  Discussed med effects and SE's. Routine screening labs and tests as requested with regular follow-up as recommended. I discussed the assessment and treatment plan as above with the patient. The patient was provided an opportunity to ask questions and all were answered. The patient agreed with the plan and demonstrated an understanding of the instructions.Over 40 minutes of exam, counseling, chart review and high complex critical decision making was performed. I provided  over 40 minutes of exam, counseling, chart review and  complex critical decision making.   Kirtland Bouchard, MD

## 2019-04-11 NOTE — Patient Instructions (Signed)
Coronavirus (COVID-19) Are you at risk?  Are you at risk for the Coronavirus (COVID-19)?  To be considered HIGH RISK for Coronavirus (COVID-19), you have to meet the following criteria:  . Traveled to [place], [place], [place], [place] or [place]; or in the [place] to [place], [place], [place]  . or [place]; and have fever, cough, and shortness of breath within the last 2 weeks of travel OR . Been in close contact with a person diagnosed with COVID-19 within the last 2 weeks and have  . fever, cough,and shortness of breath .  . IF YOU DO NOT MEET THESE CRITERIA, YOU ARE CONSIDERED LOW RISK FOR COVID-19.  What to do if you are HIGH RISK for COVID-19?  . If you are having a medical emergency, call 911. . Seek medical care right away. Before you go to a doctor's office, urgent care or emergency department, .  call ahead and tell them about your recent travel, contact with someone diagnosed with COVID-19  .  and your symptoms.  . You should receive instructions from your physician's office regarding next steps of care.  . When you arrive at healthcare provider, tell the healthcare staff immediately you have returned from  . visiting [place], [place], [place], [place] or [place]; or traveled in the [place] to [place], [place],  . [place] or [place] in the last two weeks or you have been in close contact with a person diagnosed with  . COVID-19 in the last 2 weeks.   . Tell the health care staff about your symptoms: fever, cough and shortness of breath. . After you have been seen by a medical provider, you will be either: o Tested for (COVID-19) and discharged home on quarantine except to seek medical care if  o symptoms worsen, and asked to  - Stay home and avoid contact with others until you get your results (4-5 days)  - Avoid travel on public transportation if possible (such as bus, train, or airplane) or o Sent to the Emergency Department by EMS for evaluation,  COVID-19 testing  and  o possible admission depending on your condition and test results.  What to do if you are LOW RISK for COVID-19?  Reduce your risk of any infection by using the same precautions used for avoiding the common cold or flu:  . Wash your hands often with soap and warm water for at least 20 seconds.  If soap and water are not readily available,  . use an alcohol-based hand sanitizer with at least 60% alcohol.  . If coughing or sneezing, cover your mouth and nose by coughing or sneezing into the elbow areas of your shirt or coat, .  into a tissue or into your sleeve (not your hands). . Avoid shaking hands with others and consider head nods or verbal greetings only. . Avoid touching your eyes, nose, or mouth with unwashed hands.  . Avoid close contact with people who are sick. . Avoid places or events with large numbers of people in one location, like concerts or sporting events. . Carefully consider travel plans you have or are making. . If you are planning any travel outside or inside the [place], visit the CDC's Travelers' Health webpage for the latest health notices. . If you have some symptoms but not all symptoms, continue to monitor at home and seek medical attention  . if your symptoms worsen. . If you are having a medical emergency, call 911. >>>>>>>>>>>>>>>>>>>>>>>>>>>>>>>>>>>>>>>>>>>>>>>>>>>>>>>   We Do NOT Approve of  [facility] [place] Soliciting Our Patients  To Do Home Visits  & We Do NOT Approve of [employer] > > > > > > > > > > > > > > > > > > > > > > > > > > > > > > > > > > >  > > > >   Preventive Care for Adults  A healthy lifestyle and preventive care can promote health and wellness. Preventive health guidelines for men include the following key practices:  A routine yearly physical is a good way to check with your health care provider about your health and preventative screening. It is a chance to share any concerns and updates on  your health and to receive a thorough exam.  Visit your dentist for a routine exam and preventative care every 6 months. Brush your teeth twice a day and floss once a day. Good oral hygiene prevents tooth decay and gum disease.  The frequency of eye exams is based on your age, health, family medical history, use of contact lenses, and other factors. Follow your health care provider's recommendations for frequency of eye exams.  Eat a healthy diet. Foods such as vegetables, fruits, whole grains, low-fat dairy products, and lean protein foods contain the nutrients you need without too many calories. Decrease your intake of foods high in solid fats, added sugars, and salt. Eat the right amount of calories for you. Get information about a proper diet from your health care provider, if necessary.  Regular physical exercise is one of the most important things you can do for your health. Most adults should get at least 150 minutes of moderate-intensity exercise (any activity that increases your heart rate and causes you to sweat) each week. In addition, most adults need muscle-strengthening exercises on 2 or more days a week.  Maintain a healthy weight. The body mass index (BMI) is a screening tool to identify possible weight problems. It provides an estimate of body fat based on height and weight. Your health care provider can find your BMI and can help you achieve or maintain a healthy weight. For adults 20 years and older:  A BMI below 18.5 is considered underweight.  A BMI of 18.5 to 24.9 is normal.  A BMI of 25 to 29.9 is considered overweight.  A BMI of 30 and above is considered obese.  Maintain normal blood lipids and cholesterol levels by exercising and minimizing your intake of saturated fat. Eat a balanced diet with plenty of fruit and vegetables. Blood tests for lipids and cholesterol should begin at age 20 and be repeated every 5 years. If your lipid or cholesterol levels are high, you are  over 50, or you are at high risk for heart disease, you may need your cholesterol levels checked more frequently. Ongoing high lipid and cholesterol levels should be treated with medicines if diet and exercise are not working.  If you smoke, find out from your health care provider how to quit. If you do not use tobacco, do not start.  Lung cancer screening is recommended for adults aged 55-80 years who are at high risk for developing lung cancer because of a history of smoking. A yearly low-dose CT scan of the lungs is recommended for people who have at least a 30-pack-year history of smoking and are a current smoker or have quit within the past 15 years. A pack year of smoking is smoking an average of 1   pack of cigarettes a day for 1 year (for example: 1 pack a day for 30 years or 2 packs a day for 15 years). Yearly screening should continue until the smoker has stopped smoking for at least 15 years. Yearly screening should be stopped for people who develop a health problem that would prevent them from having lung cancer treatment.  If you choose to drink alcohol, do not have more than 2 drinks per day. One drink is considered to be 12 ounces (355 mL) of beer, 5 ounces (148 mL) of wine, or 1.5 ounces (44 mL) of liquor.  Avoid use of street drugs. Do not share needles with anyone. Ask for help if you need support or instructions about stopping the use of drugs.  High blood pressure causes heart disease and increases the risk of stroke. Your blood pressure should be checked at least every 1-2 years. Ongoing high blood pressure should be treated with medicines, if weight loss and exercise are not effective.  If you are 45-79 years old, ask your health care provider if you should take aspirin to prevent heart disease.  Diabetes screening involves taking a blood sample to check your fasting blood sugar level. Testing should be considered at a younger age or be carried out more frequently if you are  overweight and have at least 1 risk factor for diabetes.  Colorectal cancer can be detected and often prevented. Most routine colorectal cancer screening begins at the age of 50 and continues through age 75. However, your health care provider may recommend screening at an earlier age if you have risk factors for colon cancer. On a yearly basis, your health care provider may provide home test kits to check for hidden blood in the stool. Use of a small camera at the end of a tube to directly examine the colon (sigmoidoscopy or colonoscopy) can detect the earliest forms of colorectal cancer. Talk to your health care provider about this at age 50, when routine screening begins. Direct exam of the colon should be repeated every 5-10 years through age 75, unless early forms of precancerous polyps or small growths are found.  Hepatitis C blood testing is recommended for all people born from 1945 through 1965 and any individual with known risks for hepatitis C.  Screening for abdominal aortic aneurysm (AAA)  by ultrasound is recommended for people who have history of high blood pressure or who are current or former smokers.  Healthy men should  receive prostate-specific antigen (PSA) blood tests as part of routine cancer screening. Talk with your health care provider about prostate cancer screening.  Testicular cancer screening is  recommended for adult males. Screening includes self-exam, a health care provider exam, and other screening tests. Consult with your health care provider about any symptoms you have or any concerns you have about testicular cancer.  Use sunscreen. Apply sunscreen liberally and repeatedly throughout the day. You should seek shade when your shadow is shorter than you. Protect yourself by wearing long sleeves, pants, a wide-brimmed hat, and sunglasses year round, whenever you are outdoors.  Once a month, do a whole-body skin exam, using a mirror to look at the skin on your back. Tell  your health care provider about new moles, moles that have irregular borders, moles that are larger than a pencil eraser, or moles that have changed in shape or color.  Stay current with required vaccines (immunizations).  Influenza vaccine. All adults should be immunized every year.  Tetanus, diphtheria, and acellular   pertussis (Td, Tdap) vaccine. An adult who has not previously received Tdap or who does not know his vaccine status should receive 1 dose of Tdap. This initial dose should be followed by tetanus and diphtheria toxoids (Td) booster doses every 10 years. Adults with an unknown or incomplete history of completing a 3-dose immunization series with Td-containing vaccines should begin or complete a primary immunization series including a Tdap dose. Adults should receive a Td booster every 10 years.  Zoster vaccine. One dose is recommended for adults aged 60 years or older unless certain conditions are present.    PREVNAR - Pneumococcal 13-valent conjugate (PCV13) vaccine. When indicated, a person who is uncertain of his immunization history and has no record of immunization should receive the PCV13 vaccine. An adult aged 19 years or older who has certain medical conditions and has not been previously immunized should receive 1 dose of PCV13 vaccine. This PCV13 should be followed with a dose of pneumococcal polysaccharide (PPSV23) vaccine. The PPSV23 vaccine dose should be obtained 1 or more year(s)after the dose of PCV13 vaccine. An adult aged 19 years or older who has certain medical conditions and previously received 1 or more doses of PPSV23 vaccine should receive 1 dose of PCV13. The PCV13 vaccine dose should be obtained 1 or more years after the last PPSV23 vaccine dose.    PNEUMOVAX - Pneumococcal polysaccharide (PPSV23) vaccine. When PCV13 is also indicated, PCV13 should be obtained first. All adults aged 65 years and older should be immunized. An adult younger than age 65 years who  has certain medical conditions should be immunized. Any person who resides in a nursing home or long-term care facility should be immunized. An adult smoker should be immunized. People with an immunocompromised condition and certain other conditions should receive both PCV13 and PPSV23 vaccines. People with human immunodeficiency virus (HIV) infection should be immunized as soon as possible after diagnosis. Immunization during chemotherapy or radiation therapy should be avoided. Routine use of PPSV23 vaccine is not recommended for American Indians, [place] Natives, or people younger than 65 years unless there are medical conditions that require PPSV23 vaccine. When indicated, people who have unknown immunization and have no record of immunization should receive PPSV23 vaccine. One-time revaccination 5 years after the first dose of PPSV23 is recommended for people aged 19-64 years who have chronic kidney failure, nephrotic syndrome, asplenia, or immunocompromised conditions. People who received 1-2 doses of PPSV23 before age 65 years should receive another dose of PPSV23 vaccine at age 65 years or later if at least 5 years have passed since the previous dose. Doses of PPSV23 are not needed for people immunized with PPSV23 at or after age 65 years.    Hepatitis A vaccine. Adults who wish to be protected from this disease, have certain high-risk conditions, work with hepatitis A-infected animals, work in hepatitis A research labs, or travel to or work in countries with a high rate of hepatitis A should be immunized. Adults who were previously unvaccinated and who anticipate close contact with an international adoptee during the first 60 days after arrival in the [place] from a country with a high rate of hepatitis A should be immunized.    Hepatitis B vaccine. Adults should be immunized if they wish to be protected from this disease, have certain high-risk conditions, may be exposed to blood or other  infectious body fluids, are household contacts or sex partners of hepatitis B positive people, are clients or workers in certain care facilities,   or travel to or work in countries with a high rate of hepatitis B.   Preventive Service / Frequency   Ages 65 and over  Blood pressure check.  Lipid and cholesterol check.  Lung cancer screening. / Every year if you are aged 55-80 years and have a 30-pack-year history of smoking and currently smoke or have quit within the past 15 years. Yearly screening is stopped once you have quit smoking for at least 15 years or develop a health problem that would prevent you from having lung cancer treatment.  Fecal occult blood test (FOBT) of stool. You may not have to do this test if you get a colonoscopy every 10 years.  Flexible sigmoidoscopy** or colonoscopy.** / Every 5 years for a flexible sigmoidoscopy or every 10 years for a colonoscopy beginning at age 50 and continuing until age 75.  Hepatitis C blood test.** / For all people born from 1945 through 1965 and any individual with known risks for hepatitis C.  Abdominal aortic aneurysm (AAA) screening./ Screening current or former smokers or have Hypertension.  Skin self-exam. / Monthly.  Influenza vaccine. / Every year.  Tetanus, diphtheria, and acellular pertussis (Tdap/Td) vaccine.** / 1 dose of Td every 10 years.   Zoster vaccine.** / 1 dose for adults aged 60 years or older.         Pneumococcal 13-valent conjugate (PCV13) vaccine.    Pneumococcal polysaccharide (PPSV23) vaccine.     Hepatitis A vaccine.** / Consult your health care provider.  Hepatitis B vaccine.** / Consult your health care provider. Screening for abdominal aortic aneurysm (AAA)  by ultrasound is recommended for people who have history of high blood pressure or who are current or former smokers. ++++++++++ Recommend Adult Low Dose Aspirin or  coated  Aspirin 81 mg daily  To reduce risk of Colon Cancer 20 %,   Skin Cancer 26 % ,  Malignant Melanoma 46%  and  Pancreatic cancer 60% ++++++++++++++++++++++ Vitamin D goal  is between 70-100.  Please make sure that you are taking your Vitamin D as directed.  It is very important as a natural anti-inflammatory  helping hair, skin, and nails, as well as reducing stroke and heart attack risk.  It helps your bones and helps with mood. It also decreases numerous cancer risks so please take it as directed.  Low Vit D is associated with a 200-300% higher risk for CANCER  and 200-300% higher risk for HEART   ATTACK  &  STROKE.   ...................................... It is also associated with higher death rate at younger ages,  autoimmune diseases like Rheumatoid arthritis, Lupus, Multiple Sclerosis.    Also many other serious conditions, like depression, Alzheimer's Dementia, infertility, muscle aches, fatigue, fibromyalgia - just to name a few. ++++++++++++++++++++++ Recommend the book "The END of DIETING" by Dr Joel Fuhrman  & the book "The END of DIABETES " by Dr Joel Fuhrman At [employer] - get book & Audio CD's    Being diabetic has a  300% increased risk for heart attack, stroke, cancer, and alzheimer- type vascular dementia. It is very important that you work harder with diet by avoiding all foods that are white. Avoid white rice (brown & wild rice is OK), white potatoes (sweetpotatoes in moderation is OK), White bread or wheat bread or anything made out of white flour like bagels, donuts, rolls, buns, biscuits, cakes, pastries, cookies, pizza crust, and pasta (made from white flour & egg whites) - vegetarian pasta or spinach or wheat   pasta is OK. Multigrain breads like Arnold's or [employer], or multigrain sandwich thins or flatbreads.  Diet, exercise and weight loss can reverse and cure diabetes in the early stages.  Diet, exercise and weight loss is very important in the control and prevention of complications of diabetes which affects every  system in your body, ie. Brain - dementia/stroke, eyes - glaucoma/blindness, heart - heart attack/heart failure, kidneys - dialysis, stomach - gastric paralysis, intestines - malabsorption, nerves - severe painful neuritis, circulation - gangrene & loss of a leg(s), and finally cancer and Alzheimers.    I recommend avoid fried & greasy foods,  sweets/candy, white rice (brown or wild rice or Quinoa is OK), white potatoes (sweet potatoes are OK) - anything made from white flour - bagels, doughnuts, rolls, buns, biscuits,white and wheat breads, pizza crust and traditional pasta made of white flour & egg white(vegetarian pasta or spinach or wheat pasta is OK).  Multi-grain bread is OK - like multi-grain flat bread or sandwich thins. Avoid alcohol in excess. Exercise is also important.    Eat all the vegetables you want - avoid meat, especially red meat and dairy - especially cheese.  Cheese is the most concentrated form of trans-fats which is the worst thing to clog up our arteries. Veggie cheese is OK which can be found in the fresh produce section at [employer] or [employer] or [employer]  ++++++++++++++++++++++ DASH Eating Plan  DASH stands for "Dietary Approaches to Stop Hypertension."   The DASH eating plan is a healthy eating plan that has been shown to reduce high blood pressure (hypertension). Additional health benefits may include reducing the risk of type 2 diabetes mellitus, heart disease, and stroke. The DASH eating plan may also help with weight loss. WHAT DO I NEED TO KNOW ABOUT THE DASH EATING PLAN? For the DASH eating plan, you will follow these general guidelines:  Choose foods with a percent daily value for sodium of less than 5% (as listed on the food label).  Use salt-free seasonings or herbs instead of table salt or sea salt.  Check with your health care provider or pharmacist before using salt substitutes.  Eat lower-sodium products, often labeled as "lower sodium" or "no  salt added."  Eat fresh foods.  Eat more vegetables, fruits, and low-fat dairy products.  Choose whole grains. Look for the word "whole" as the first word in the ingredient list.  Choose fish   Limit sweets, desserts, sugars, and sugary drinks.  Choose heart-healthy fats.  Eat veggie cheese   Eat more home-cooked food and less restaurant, buffet, and fast food.  Limit fried foods.  Cook foods using methods other than frying.  Limit canned vegetables. If you do use them, rinse them well to decrease the sodium.  When eating at a restaurant, ask that your food be prepared with less salt, or no salt if possible.                      WHAT FOODS CAN I EAT? Read Dr Joel Fuhrman's books on The End of Dieting & The End of Diabetes  Grains Whole grain or whole wheat bread. Brown rice. Whole grain or whole wheat pasta. Quinoa, bulgur, and whole grain cereals. Low-sodium cereals. Corn or whole wheat flour tortillas. Whole grain cornbread. Whole grain crackers. Low-sodium crackers.  Vegetables Fresh or frozen vegetables (raw, steamed, roasted, or grilled). Low-sodium or reduced-sodium tomato and vegetable juices. Low-sodium or reduced-sodium tomato sauce and paste. Low-sodium   or reduced-sodium canned vegetables.   Fruits All fresh, canned (in natural juice), or frozen fruits.  Protein Products  All fish and seafood.  Dried beans, peas, or lentils. Unsalted nuts and seeds. Unsalted canned beans.  Dairy Low-fat dairy products, such as skim or 1% milk, 2% or reduced-fat cheeses, low-fat ricotta or cottage cheese, or plain low-fat yogurt. Low-sodium or reduced-sodium cheeses.  Fats and Oils Tub margarines without trans fats. Light or reduced-fat mayonnaise and salad dressings (reduced sodium). Avocado. Safflower, olive, or canola oils. Natural peanut or almond butter.  Other Unsalted popcorn and pretzels. The items listed above may not be a complete list of recommended foods or  beverages. Contact your dietitian for more options.  ++++++++++++++++++++  WHAT FOODS ARE NOT RECOMMENDED? Grains/ White flour or wheat flour White bread. White pasta. White rice. Refined cornbread. Bagels and croissants. Crackers that contain trans fat.  Vegetables  Creamed or fried vegetables. Vegetables in a . Regular canned vegetables. Regular canned tomato sauce and paste. Regular tomato and vegetable juices.  Fruits Dried fruits. Canned fruit in light or heavy syrup. Fruit juice.  Meat and Other Protein Products Meat in general - RED meat & White meat.  Fatty cuts of meat. Ribs, chicken wings, all processed meats as bacon, sausage, bologna, salami, fatback, hot dogs, bratwurst and packaged luncheon meats.  Dairy Whole or 2% milk, cream, half-and-half, and cream cheese. Whole-fat or sweetened yogurt. Full-fat cheeses or blue cheese. Non-dairy creamers and whipped toppings. Processed cheese, cheese spreads, or cheese curds.  Condiments Onion and garlic salt, seasoned salt, table salt, and sea salt. Canned and packaged gravies. Worcestershire sauce. Tartar sauce. Barbecue sauce. Teriyaki sauce. Soy sauce, including reduced sodium. Steak sauce. Fish sauce. Oyster sauce. Cocktail sauce. Horseradish. Ketchup and mustard. Meat flavorings and tenderizers. Bouillon cubes. Hot sauce. Tabasco sauce. Marinades. Taco seasonings. Relishes.  Fats and Oils Butter, stick margarine, lard, shortening and bacon fat. Coconut, palm kernel, or palm oils. Regular salad dressings.  Pickles and olives. Salted popcorn and pretzels.  The items listed above may not be a complete list of foods and beverages to avoid.    

## 2019-04-12 ENCOUNTER — Ambulatory Visit (INDEPENDENT_AMBULATORY_CARE_PROVIDER_SITE_OTHER): Payer: BLUE CROSS/BLUE SHIELD | Admitting: Internal Medicine

## 2019-04-12 ENCOUNTER — Other Ambulatory Visit: Payer: Self-pay

## 2019-04-12 VITALS — BP 126/88 | HR 76 | Temp 97.0°F | Resp 18 | Ht 75.0 in | Wt 272.6 lb

## 2019-04-12 DIAGNOSIS — Z0000 Encounter for general adult medical examination without abnormal findings: Secondary | ICD-10-CM | POA: Diagnosis not present

## 2019-04-12 DIAGNOSIS — Z1211 Encounter for screening for malignant neoplasm of colon: Secondary | ICD-10-CM

## 2019-04-12 DIAGNOSIS — N401 Enlarged prostate with lower urinary tract symptoms: Secondary | ICD-10-CM

## 2019-04-12 DIAGNOSIS — Z13220 Encounter for screening for lipoid disorders: Secondary | ICD-10-CM | POA: Diagnosis not present

## 2019-04-12 DIAGNOSIS — R0989 Other specified symptoms and signs involving the circulatory and respiratory systems: Secondary | ICD-10-CM

## 2019-04-12 DIAGNOSIS — Z1212 Encounter for screening for malignant neoplasm of rectum: Secondary | ICD-10-CM

## 2019-04-12 DIAGNOSIS — R350 Frequency of micturition: Secondary | ICD-10-CM

## 2019-04-12 DIAGNOSIS — Z79899 Other long term (current) drug therapy: Secondary | ICD-10-CM | POA: Diagnosis not present

## 2019-04-12 DIAGNOSIS — E782 Mixed hyperlipidemia: Secondary | ICD-10-CM

## 2019-04-12 DIAGNOSIS — Z131 Encounter for screening for diabetes mellitus: Secondary | ICD-10-CM | POA: Diagnosis not present

## 2019-04-12 DIAGNOSIS — Z125 Encounter for screening for malignant neoplasm of prostate: Secondary | ICD-10-CM | POA: Diagnosis not present

## 2019-04-12 DIAGNOSIS — Z136 Encounter for screening for cardiovascular disorders: Secondary | ICD-10-CM

## 2019-04-12 DIAGNOSIS — Z130 Encounter for screening for diseases of the blood and blood-forming organs and certain disorders involving the immune mechanism: Secondary | ICD-10-CM

## 2019-04-12 DIAGNOSIS — R5383 Other fatigue: Secondary | ICD-10-CM

## 2019-04-12 DIAGNOSIS — I10 Essential (primary) hypertension: Secondary | ICD-10-CM | POA: Diagnosis not present

## 2019-04-12 DIAGNOSIS — E349 Endocrine disorder, unspecified: Secondary | ICD-10-CM

## 2019-04-12 DIAGNOSIS — K219 Gastro-esophageal reflux disease without esophagitis: Secondary | ICD-10-CM

## 2019-04-12 DIAGNOSIS — Z1329 Encounter for screening for other suspected endocrine disorder: Secondary | ICD-10-CM

## 2019-04-12 DIAGNOSIS — Z111 Encounter for screening for respiratory tuberculosis: Secondary | ICD-10-CM | POA: Diagnosis not present

## 2019-04-12 DIAGNOSIS — R7303 Prediabetes: Secondary | ICD-10-CM

## 2019-04-12 DIAGNOSIS — E559 Vitamin D deficiency, unspecified: Secondary | ICD-10-CM

## 2019-04-12 DIAGNOSIS — Z1389 Encounter for screening for other disorder: Secondary | ICD-10-CM | POA: Diagnosis not present

## 2019-04-12 DIAGNOSIS — R7309 Other abnormal glucose: Secondary | ICD-10-CM

## 2019-04-12 DIAGNOSIS — Z0001 Encounter for general adult medical examination with abnormal findings: Secondary | ICD-10-CM

## 2019-04-12 MED ORDER — FAMOTIDINE 40 MG PO TABS: ORAL_TABLET | ORAL | 3 refills | Status: DC

## 2019-04-13 LAB — COMPLETE METABOLIC PANEL WITH GFR
AG Ratio: 1.3 (calc) (ref 1.0–2.5)
ALT: 16 U/L (ref 9–46)
AST: 14 U/L (ref 10–35)
Albumin: 4 g/dL (ref 3.6–5.1)
Alkaline phosphatase (APISO): 97 U/L (ref 35–144)
BUN: 15 mg/dL (ref 7–25)
CO2: 27 mmol/L (ref 20–32)
Calcium: 8.8 mg/dL (ref 8.6–10.3)
Chloride: 103 mmol/L (ref 98–110)
Creat: 0.96 mg/dL (ref 0.70–1.33)
GFR, Est African American: 100 mL/min/{1.73_m2} (ref >=60)
GFR, Est Non African American: 86 mL/min/{1.73_m2} (ref >=60)
Globulin: 3.1 g/dL (calc) (ref 1.9–3.7)
Glucose, Bld: 91 mg/dL (ref 65–99)
Potassium: 4.3 mmol/L (ref 3.5–5.3)
Sodium: 138 mmol/L (ref 135–146)
Total Bilirubin: 0.5 mg/dL (ref 0.2–1.2)
Total Protein: 7.1 g/dL (ref 6.1–8.1)

## 2019-04-13 LAB — URINALYSIS, ROUTINE W REFLEX MICROSCOPIC
Bilirubin Urine: NEGATIVE
Glucose, UA: NEGATIVE
Hgb urine dipstick: NEGATIVE
Ketones, ur: NEGATIVE
Leukocytes,Ua: NEGATIVE
Nitrite: NEGATIVE
Protein, ur: NEGATIVE
Specific Gravity, Urine: 1.017 (ref 1.001–1.03)
pH: <=5 (ref 5.0–8.0)

## 2019-04-13 LAB — HEMOGLOBIN A1C
Hgb A1c MFr Bld: 5.3 % of total Hgb (ref <5.7)
Mean Plasma Glucose: 105 (calc)
eAG (mmol/L): 5.8 (calc)

## 2019-04-13 LAB — TESTOSTERONE: Testosterone: 126 ng/dL — ABNORMAL LOW (ref 250–827)

## 2019-04-13 LAB — TSH: TSH: 2.21 mIU/L (ref 0.40–4.50)

## 2019-04-13 LAB — CBC WITH DIFFERENTIAL/PLATELET
Absolute Monocytes: 492 cells/uL (ref 200–950)
Basophils Absolute: 41 cells/uL (ref 0–200)
Basophils Relative: 0.5 %
Eosinophils Absolute: 271 cells/uL (ref 15–500)
Eosinophils Relative: 3.3 %
HCT: 42.6 % (ref 38.5–50.0)
Hemoglobin: 14.1 g/dL (ref 13.2–17.1)
Lymphs Abs: 1878 cells/uL (ref 850–3900)
MCH: 29.4 pg (ref 27.0–33.0)
MCHC: 33.1 g/dL (ref 32.0–36.0)
MCV: 88.8 fL (ref 80.0–100.0)
MPV: 10.7 fL (ref 7.5–12.5)
Monocytes Relative: 6 %
Neutro Abs: 5519 cells/uL (ref 1500–7800)
Neutrophils Relative %: 67.3 %
Platelets: 318 10*3/uL (ref 140–400)
RBC: 4.8 10*6/uL (ref 4.20–5.80)
RDW: 12.9 % (ref 11.0–15.0)
Total Lymphocyte: 22.9 %
WBC: 8.2 10*3/uL (ref 3.8–10.8)

## 2019-04-13 LAB — LIPID PANEL
Cholesterol: 187 mg/dL (ref <200)
HDL: 45 mg/dL (ref >=40)
LDL Cholesterol (Calc): 117 mg/dL (calc) — ABNORMAL HIGH
Non-HDL Cholesterol (Calc): 142 mg/dL (calc) — ABNORMAL HIGH (ref <130)
Total CHOL/HDL Ratio: 4.2 (calc) (ref <5.0)
Triglycerides: 142 mg/dL (ref <150)

## 2019-04-13 LAB — MICROALBUMIN / CREATININE URINE RATIO
Creatinine, Urine: 120 mg/dL (ref 20–320)
Microalb Creat Ratio: 3 mcg/mg creat (ref <30)
Microalb, Ur: 0.3 mg/dL

## 2019-04-13 LAB — IRON, TOTAL/TOTAL IRON BINDING CAP: %SAT: 37 % (calc) (ref 20–48)

## 2019-04-13 LAB — IRON,?TOTAL/TOTAL IRON BINDING CAP
Iron: 109 ug/dL (ref 50–180)
TIBC: 298 mcg/dL (calc) (ref 250–425)

## 2019-04-13 LAB — VITAMIN B12: Vitamin B-12: 419 pg/mL (ref 200–1100)

## 2019-04-13 LAB — PSA: PSA: 1.8 ng/mL (ref <=4.0)

## 2019-04-13 LAB — VITAMIN D 25 HYDROXY (VIT D DEFICIENCY, FRACTURES): Vit D, 25-Hydroxy: 26 ng/mL — ABNORMAL LOW (ref 30–100)

## 2019-04-13 LAB — INSULIN, RANDOM: Insulin: 8.3 u[IU]/mL

## 2019-04-13 LAB — MAGNESIUM: Magnesium: 2.1 mg/dL (ref 1.5–2.5)

## 2019-05-04 ENCOUNTER — Other Ambulatory Visit: Payer: Self-pay

## 2019-05-04 ENCOUNTER — Ambulatory Visit (INDEPENDENT_AMBULATORY_CARE_PROVIDER_SITE_OTHER): Payer: BLUE CROSS/BLUE SHIELD | Admitting: Adult Health

## 2019-05-04 ENCOUNTER — Encounter: Payer: Self-pay | Admitting: Adult Health

## 2019-05-04 VITALS — BP 136/82 | HR 78 | Temp 97.3°F | Ht 75.0 in | Wt 277.0 lb

## 2019-05-04 DIAGNOSIS — R310 Gross hematuria: Secondary | ICD-10-CM

## 2019-05-04 DIAGNOSIS — R300 Dysuria: Secondary | ICD-10-CM

## 2019-05-04 NOTE — Progress Notes (Signed)
Assessment and Plan:  Soma was seen today for hematuria and dysuria.  Diagnoses and all orders for this visit:  Gross hematuria/dysuria Low risk hx, benign exam stop aspirin until resolves Check for infection, renal changes today Push water, avoid NSAIDs, ETOH, dark soda If persistent and unexplained, will obtain CTU, then if needed consider referral to urology or nephrology as indicated by labs Call if hematuria worsening, though appears to be improving spontaneously thus far -     CBC with Differential/Platelet -     BASIC METABOLIC PANEL WITH GFR -     Urinalysis w microscopic + reflex culture  Further disposition pending results of labs. Discussed med's effects and SE's.   Over 15 minutes of exam, counseling, chart review, and critical decision making was performed.   Future Appointments  Date Time Provider [place]  05/16/2020 10:00 AM Unk Pinto, MD [facility] None    ------------------------------------------------------------------------------------------------------------------   HPI BP 136/82   Pulse 78   Temp (!) 97.3 F (36.3 C)   Ht 6\' 3"  (1.905 m)   Wt 277 lb (125.6 kg)   SpO2 99%   BMI 34.62 kg/m   58 y.o.male presents for evaluation of frank hematuria that began yesterday; he reports he experienced urgency yesterday, noted very bloody urine, noted small clots throughout the day yesterday. Seemed to clear up some during the night, this AM having some small clots again.   He endorses some dysuria, but otherwise denies fever/chills, headache, dizziness, nausea, abdominal pain, flank pain, urgency, urine odor, slowed/weak flow, deviated or split stream, perineal discomfort, scrotal discomfort.   He does endorse a very mild left mid abdominal discomfort, sharp, short/brief then resolves spontaneously, not associated with eating or any other particular activities, ongoing for several months but didn't think much of it.   No aggressive exercise,  trauma, or unusual acitvities recently   He does not have any smoking history other than second hand smoke from his parents as a child, no other significant chemical exposure history  No personal or family hx of kidney problems. Historically has never had hematuria on screening UAs.   He has had US abdomen in 2012 which showed:   Right kidney:  No hydronephrosis.  Well-preserved cortex.  Normal parenchymal echotexture without focal abnormalities.  Right renal length is 12.3 cm.  Left kidney:  No hydronephrosis.  Well-preserved cortex.  Normal parenchymal echotexture without focal abnormalities.  Left renal length is 12.4 cm. There is dromedary hump configuration of the left kidney.  Past Medical History:  Diagnosis Date  . Diverticulosis of colon    mild  02/06/2011  . GERD (gastroesophageal reflux disease)   . Varicose veins      No Known Allergies  Current Outpatient Medications on File Prior to Visit  Medication Sig  . Cholecalciferol (VITAMIN D PO) Take 5,000 Units by mouth 2 (two) times daily.  Marland Kitchen guaiFENesin (MUCINEX) 600 MG 12 hr tablet Take by mouth 2 (two) times daily.  . Multiple Vitamin (MULTI-VITAMINS) TABS Take 1 tablet by mouth daily.  . Zinc Chelated 50 MG TABS Take 50 mg by mouth daily.  Marland Kitchen aspirin EC 81 MG tablet Take 81 mg by mouth daily.  . famotidine (PEPCID) 40 MG tablet Take 1 tablet at bedtime for Acid Reflux  . Multiple Vitamins-Minerals (ZINC PO) Take by mouth.  . ranitidine (ZANTAC) 75 MG tablet Take 75 mg by mouth 2 (two) times daily. Takes PRN   No current facility-administered medications on file prior to visit.  ROS: all negative except above.   Physical Exam:  BP 136/82   Pulse 78   Temp (!) 97.3 F (36.3 C)   Ht 6\' 3"  (1.905 m)   Wt 277 lb (125.6 kg)   SpO2 99%   BMI 34.62 kg/m   General Appearance: Well nourished, in no apparent distress. Eyes: conjunctiva no swelling or erythema ENT/Mouth: Hearing normal.  Neck:  Supple Respiratory: Respiratory effort normal, BS equal bilaterally without rales, rhonchi, wheezing or stridor.  Cardio: RRR with no MRGs. Brisk peripheral pulses without edema.  Abdomen: Soft, obese abdomen, + BS.  Non tender, no guarding, rebound, hernias, masses.No CVA tenderness Lymphatics: Non tender without lymphadenopathy.  Musculoskeletal: normal gait.  Skin: Warm, dry without rashes, lesions, ecchymosis.  Neuro: Normal muscle tone Psych: Awake and oriented X 3, normal affect, Insight and Judgment appropriate.     Izora Ribas, NP 9:29 AM [facility]

## 2019-05-04 NOTE — Patient Instructions (Addendum)
Hold aspirin until blood in urine stops  Push water; avoid dark sodas, excess coffee, alcohol   Avoid NSAIDs for a while (ibuprofen, aleve)   Checking for kidney changes, bladder infection today  If bleeding is persistent and unexplained, we will get a CT scan of urinary structures   Hematuria, Adult Hematuria is blood in the urine. Blood may be visible in the urine, or it may be identified with a test. This condition can be caused by infections of the bladder, urethra, kidney, or prostate. Other possible causes include:  Kidney stones.  Cancer of the urinary tract.  Too much calcium in the urine.  Conditions that are passed from parent to child (inherited conditions).  Exercise that requires a lot of energy. Infections can usually be treated with medicine, and a kidney stone usually will pass through your urine. If neither of these is the cause of your hematuria, more tests may be needed to identify the cause of your symptoms. It is very important to tell your health care provider about any blood in your urine, even if it is painless or the blood stops without treatment. Blood in the urine, when it happens and then stops and then happens again, can be a symptom of a very serious condition, including cancer. There is no pain in the initial stages of many urinary cancers. Follow these instructions at home: Medicines  Take over-the-counter and prescription medicines only as told by your health care provider.  If you were prescribed an antibiotic medicine, take it as told by your health care provider. Do not stop taking the antibiotic even if you start to feel better. Eating and drinking  Drink enough fluid to keep your urine clear or pale yellow. It is recommended that you drink 3-4 quarts (2.8-3.8 L) a day. If you have been diagnosed with an infection, it is recommended that you drink cranberry juice in addition to large amounts of water.  Avoid caffeine, tea, and carbonated  beverages. These tend to irritate the bladder.  Avoid alcohol because it may irritate the prostate (men). General instructions  If you have been diagnosed with a kidney stone, follow your health care provider's instructions about straining your urine to catch the stone.  Empty your bladder often. Avoid holding urine for long periods of time.  If you are male: ? After a bowel movement, wipe from front to back and use each piece of toilet paper only once. ? Empty your bladder before and after sex.  Pay attention to any changes in your symptoms. Tell your health care provider about any changes or any new symptoms.  It is your responsibility to get your test results. Ask your health care provider, or the department performing the test, when your results will be ready.  Keep all follow-up visits as told by your health care provider. This is important. Contact a health care provider if:  You develop back pain.  You have a fever.  You have nausea or vomiting.  Your symptoms do not improve after 3 days.  Your symptoms get worse. Get help right away if:  You develop severe vomiting and are unable take medicine without vomiting.  You develop severe pain in your back or abdomen even though you are taking medicine.  You pass a large amount of blood in your urine.  You pass blood clots in your urine.  You feel very weak or like you might faint.  You faint. Summary  Hematuria is blood in the urine. It has  many possible causes.  It is very important that you tell your health care provider about any blood in your urine, even if it is painless or the blood stops without treatment.  Take over-the-counter and prescription medicines only as told by your health care provider.  Drink enough fluid to keep your urine clear or pale yellow. This information is not intended to replace advice given to you by your health care provider. Make sure you discuss any questions you have with your  health care provider. Document Released: 11/30/2005 Document Revised: 01/02/2017 Document Reviewed: 01/02/2017 Elsevier Interactive Patient Education  2019 [employer].     Generally a cough is either coming from above or from below- so we will treat this OR it can be from irritation/viral cough  To treat the nasal drip: Get on the chlorphenirmine every 6 hours- This medication can make you sleepy but helps with nasal drip- get from over the counter.   Can do a steroid nasal spary 1-2 sparys at night each nostril. Remember to spray each nostril twice towards the outer part of your eye.  Do not sniff but instead pinch your nose and tilt your head back to help the medicine get into your sinuses.  The best time to do this is at bedtime. Stop if you get blurred vision or nose bleeds.   To treat the reflux Will send in prilosec 40 mg to take once in the morning and take prevacid from over the counter at night for 2 weeks- then stop the prilosec and continue the pravacid or famotadine  To stop irritation: Need to STOP the cough Do sugar free candy Do the tessalon drops VOICE REST is VERY important  If not better in 2 weeks will refer to ENT  Go to the ER or call the office if you get any chest pain, shortness of breath, severe  headache, leg swelling.    Common causes of cough OR hoarseness OR sore throat:   Allergies, Viral Infections, Acid Reflux and Bacterial Infections.    Allergies and viral infections cause a cough OR sore throat by post nasal drip and are often worse at night, can also have sneezing, lower grade fevers, clear/yellow mucus. This is best treated with allergy medications or nasal sprays.  Please get on allegra for 1-2 weeks The strongest is allegra or fexafinadine  Cheapest at [employer], [employer], [employer]   Bacterial infections are more severe than allergies or viral infections with fever, teeth pain, fatigue. This can be treated with prednisone and the same over the  counter medication and after 7 days can be treated with an antibiotic.   Silent reflux/GERD can cause a cough OR sore throat OR hoarseness WITHOUT heart burn because the esophagus that goes to the stomach and trachea that goes to the lungs are very close and when you lay down the acid can irritate your throat and lungs. This can cause hoarseness, cough, and wheezing. Please stop any alcohol or anti-inflammatories like aleve/advil/ibuprofen and start an over the counter Prilosec or omeprazole 1-2 times daily 66mins before food for 2 weeks, then switch to over the counter zantac/ratinidine or pepcid/famotadine once at night for 2 weeks.    sometimes irritation causes more irritation. Try voice rest, use sugar free cough drops to prevent coughing, and try to stop clearing your throat.   If you ever have a cough that does not go away after trying these things please make a follow up visit for further evaluation or we can  refer you to a specialist. Or if you ever have shortness of breath or chest pain go to the ER.    Silent reflux: Not all heartburn burns...Marland KitchenMarland KitchenMarland Kitchen  What is LPR? Laryngopharyngeal reflux (LPR) or silent reflux is a condition in which acid that is made in the stomach travels up the esophagus (swallowing tube) and gets to the throat. Not everyone with reflux has a lot of heartburn or indigestion. In fact, many people with LPR never have heartburn. This is why LPR is called SILENT REFLUX, and the terms "Silent reflux" and "LPR" are often used interchangeably. Because LPR is silent, it is sometimes difficult to diagnose.  How can you tell if you have LPR?  Marland Kitchen Chronic hoarseness- Some people have hoarseness that comes and goes . throat clearing  . Cough . It can cause shortness of breath and cause asthma like symptoms. Marland Kitchen a feeling of a lump in the throat  . difficulty swallowing . a problem with too much nose and throat drainage.  . Some people will feel their esophagus spasm which feels like  their heart beating hard and fast, this will usually be after a meal, at rest, or lying down at night.    How do I treat this? Treatment for LPR should be individualized, and your doctor will suggest the best treatment for you. Generally there are several treatments for LPR: . changing habits and diet to reduce reflux,  . medications to reduce stomach acid, and  . surgery to prevent reflux. Most people with LPR need to modify how and when they eat, as well as take some medication, to get well. Sometimes, nonprescription liquid antacids, such as Maalox, Gelucil and Mylanta are recommended. When used, these antacids should be taken four times each day - one tablespoon one hour after each meal and before bedtime. Dietary and lifestyle changes alone are not often enough to control LPR - medications that reduce stomach acid are also usually needed. These must be prescribed by our doctor.   TIPS FOR REDUCING REFLUX AND LPR Control your LIFE-STYLE and your DIET! Marland Kitchen If you use tobacco, QUIT.  Marland Kitchen Smoking makes you reflux. After every cigarette you have some LPR.  . Don't wear clothing that is too tight, especially around the waist (trousers, corsets, belts).  . Do not lie down just after eating...in fact, do not eat within three hours of bedtime.  . You should be on a low-fat diet.  . Limit your intake of red meat.  . Limit your intake of butter.  Marland Kitchen Avoid fried foods.  . Avoid chocolate  . Avoid cheese.  Marland Kitchen Avoid eggs. Marland Kitchen Specifically avoid caffeine (especially coffee and tea), soda pop (especially cola) and mints.  . Avoid alcoholic beverages, particularly in the evening.

## 2019-05-05 ENCOUNTER — Other Ambulatory Visit: Payer: Self-pay | Admitting: Adult Health

## 2019-05-05 DIAGNOSIS — R310 Gross hematuria: Secondary | ICD-10-CM

## 2019-05-05 LAB — URINALYSIS W MICROSCOPIC + REFLEX CULTURE
Bacteria, UA: NONE SEEN /HPF
Bilirubin Urine: NEGATIVE
Glucose, UA: NEGATIVE
Hyaline Cast: NONE SEEN /LPF
Ketones, ur: NEGATIVE
Leukocyte Esterase: NEGATIVE
Nitrites, Initial: NEGATIVE
Protein, ur: NEGATIVE
Specific Gravity, Urine: 1.009 (ref 1.001–1.03)
Squamous Epithelial / HPF: NONE SEEN /HPF (ref <=5)
pH: <=5 (ref 5.0–8.0)

## 2019-05-05 LAB — BASIC METABOLIC PANEL WITH GFR
BUN: 13 mg/dL (ref 7–25)
CO2: 26 mmol/L (ref 20–32)
Calcium: 9.4 mg/dL (ref 8.6–10.3)
Chloride: 103 mmol/L (ref 98–110)
Creat: 0.94 mg/dL (ref 0.70–1.33)
GFR, Est African American: 102 mL/min/{1.73_m2} (ref >=60)
GFR, Est Non African American: 88 mL/min/{1.73_m2} (ref >=60)
Glucose, Bld: 91 mg/dL (ref 65–99)
Potassium: 4.7 mmol/L (ref 3.5–5.3)
Sodium: 139 mmol/L (ref 135–146)

## 2019-05-05 LAB — CBC WITH DIFFERENTIAL/PLATELET
Absolute Monocytes: 537 cells/uL (ref 200–950)
Basophils Absolute: 36 cells/uL (ref 0–200)
Basophils Relative: 0.4 %
Eosinophils Absolute: 246 cells/uL (ref 15–500)
Eosinophils Relative: 2.7 %
HCT: 42.9 % (ref 38.5–50.0)
Hemoglobin: 14.2 g/dL (ref 13.2–17.1)
Lymphs Abs: 2029 cells/uL (ref 850–3900)
MCH: 29.2 pg (ref 27.0–33.0)
MCHC: 33.1 g/dL (ref 32.0–36.0)
MCV: 88.3 fL (ref 80.0–100.0)
MPV: 10.9 fL (ref 7.5–12.5)
Monocytes Relative: 5.9 %
Neutro Abs: 6252 cells/uL (ref 1500–7800)
Neutrophils Relative %: 68.7 %
Platelets: 301 10*3/uL (ref 140–400)
RBC: 4.86 10*6/uL (ref 4.20–5.80)
RDW: 13 % (ref 11.0–15.0)
Total Lymphocyte: 22.3 %
WBC: 9.1 10*3/uL (ref 3.8–10.8)

## 2019-05-05 LAB — NO CULTURE INDICATED

## 2019-05-06 ENCOUNTER — Other Ambulatory Visit: Payer: Self-pay | Admitting: Internal Medicine

## 2019-05-06 DIAGNOSIS — R310 Gross hematuria: Secondary | ICD-10-CM

## 2019-05-22 ENCOUNTER — Other Ambulatory Visit: Payer: Commercial Managed Care - HMO

## 2019-05-22 LAB — TB SKIN TEST
Induration: 0 mm
TB Skin Test: NEGATIVE

## 2019-06-07 ENCOUNTER — Ambulatory Visit
Admission: RE | Admit: 2019-06-07 | Discharge: 2019-06-07 | Disposition: A | Discharge status: Home / Self Care | Payer: BC Managed Care – PPO | Source: Ambulatory Visit | Attending: Adult Health | Admitting: Adult Health

## 2019-06-07 ENCOUNTER — Other Ambulatory Visit: Payer: Self-pay

## 2019-06-07 DIAGNOSIS — R310 Gross hematuria: Secondary | ICD-10-CM | POA: Diagnosis not present

## 2019-06-07 MED ORDER — IOPAMIDOL (ISOVUE-300) INJECTION 61%
100.0000 mL | Freq: Once | INTRAVENOUS | Status: AC | PRN
  Administered 2019-06-07: 125 mL via INTRAVENOUS

## 2019-09-18 ENCOUNTER — Other Ambulatory Visit: Payer: Self-pay

## 2019-09-18 DIAGNOSIS — Z20828 Contact with and (suspected) exposure to other viral communicable diseases: Secondary | ICD-10-CM | POA: Diagnosis not present

## 2019-09-18 DIAGNOSIS — Z20822 Contact with and (suspected) exposure to covid-19: Secondary | ICD-10-CM

## 2019-09-20 LAB — NOVEL CORONAVIRUS, NAA: SARS-CoV-2, NAA: NOT DETECTED

## 2019-10-25 ENCOUNTER — Other Ambulatory Visit: Payer: Self-pay

## 2019-10-25 DIAGNOSIS — Z20822 Contact with and (suspected) exposure to covid-19: Secondary | ICD-10-CM

## 2019-10-27 LAB — NOVEL CORONAVIRUS, NAA: SARS-CoV-2, NAA: NOT DETECTED

## 2019-10-28 DIAGNOSIS — Z23 Encounter for immunization: Secondary | ICD-10-CM | POA: Diagnosis not present

## 2019-11-06 ENCOUNTER — Other Ambulatory Visit: Payer: Self-pay

## 2019-11-06 DIAGNOSIS — Z20822 Contact with and (suspected) exposure to covid-19: Secondary | ICD-10-CM

## 2019-11-07 LAB — NOVEL CORONAVIRUS, NAA: SARS-CoV-2, NAA: NOT DETECTED

## 2020-03-11 ENCOUNTER — Encounter: Payer: Self-pay | Admitting: Internal Medicine

## 2020-05-16 ENCOUNTER — Encounter: Payer: BLUE CROSS/BLUE SHIELD | Admitting: Internal Medicine

## 2020-05-21 ENCOUNTER — Encounter: Payer: Self-pay | Admitting: Internal Medicine

## 2020-05-21 NOTE — Patient Instructions (Signed)

## 2020-05-21 NOTE — Progress Notes (Signed)
Annual  Screening/Preventative Visit  & Comprehensive Evaluation & Examination     This very nice 59 y.o. MWM presents for a Screening /Preventative Visit & comprehensive evaluation and management of multiple medical co-morbidities.  Patient has been followed for HTN, HLD, T2_NIDDM  Prediabetes and Vitamin D Deficiency. Patient has GERD controlled on his Famotidine.      Patient has been followed for labile HTN since Jan 2017. Patient's BP has been controlled at home.  Today's  .  In 2017, patient had a negative Cardiolite. Patient denies any cardiac symptoms as chest pain, palpitations, shortness of breath, dizziness or ankle swelling.     Patient's hyperlipidemia is not  controlled with diet. Last lipids were not at goal:  Lab Results  Component Value Date   CHOL 187 04/12/2019   HDL 45 04/12/2019   LDLCALC 117 (H) 04/12/2019   TRIG 142 04/12/2019   CHOLHDL 4.2 04/12/2019       Patient has Morbid Obesity (BMI 34.6+) and hx/o prediabetes (A1c 5.8% / Jan 2017)  and patient denies reactive hypoglycemic symptoms, visual blurring, diabetic polys or paresthesias. Last A1c was Normal & at goal:  Lab Results  Component Value Date   HGBA1C 5.3 04/12/2019       Patient has hx/o Low Testosterone ("171" / 2017 - "122" / 2018 and "173" /  2019) and has  deferred replacement therapy. He was advised starting Zinc last year. Finally, patient has history of Vitamin D Deficiency ("26" / 2017)  and last vitamin D was still very low :  Lab Results  Component Value Date   VD25OH 26 (L) 04/12/2019    Current Outpatient Medications on File Prior to Visit  Medication Sig  . aspirin EC 81 MG tablet Take 81 mg by mouth daily.  . Cholecalciferol (VITAMIN D PO) Take 5,000 Units by mouth 2 (two) times daily.  . famotidine (PEPCID) 40 MG tablet Take 1 tablet at bedtime for Acid Reflux  . guaiFENesin (MUCINEX) 600 MG 12 hr tablet Take by mouth 2 (two) times daily.  . Multiple Vitamin (MULTI-VITAMINS)  TABS Take 1 tablet by mouth daily.  . Multiple Vitamins-Minerals (ZINC PO) Take by mouth.  . Zinc Chelated 50 MG TABS Take 50 mg by mouth daily.   No current facility-administered medications on file prior to visit.   No Known Allergies   Past Medical History:  Diagnosis Date  . Diverticulosis of colon    mild  02/06/2011  . GERD (gastroesophageal reflux disease)   . Varicose veins    Health Maintenance  Topic Date Due  . COVID-19 Vaccine (1) Never done  . INFLUENZA VACCINE  07/14/2020  . COLONOSCOPY  08/21/2021  . TETANUS/TDAP  12/30/2025  . Hepatitis C Screening  Completed  . HIV Screening  Completed   Immunization History  Administered Date(s) Administered  . Influenza,inj,Quad PF,6+ Mos 10/01/2018  . Influenza-Unspecified 09/26/2016, 10/14/2017  . PPD Test 12/31/2015, 01/05/2017, 02/03/2018, 04/12/2019  . Tdap 12/31/2015   Last Colon - 08/21/2016 - Dr Fuller Plan - recommended 5 yr f/u - due Sept 2022  Past Surgical History:  Procedure Laterality Date  . BASAL CELL CARCINOMA EXCISION     removed from back,shoulder blade, and lower back  . CHOLECYSTECTOMY    . PILONIDAL CYST EXCISION    . VARICOSE VEIN SURGERY     right leg   Family History  Problem Relation Age of Onset  . Colon cancer Father   . Cancer Father   .  Alzheimer's disease Mother   . Colon cancer Paternal Aunt   . Colon cancer Paternal Grandmother   . Coronary artery disease Neg Hx    Social History   Socioeconomic History  . Marital status: Married    Spouse name: Not on file  . Number of children: Not on file  . Years of education: Not on file  . Highest education level: Not on file  Occupational History  . Not on file  Tobacco Use  . Smoking status: Never Smoker  . Smokeless tobacco: Never Used  . Tobacco comment: 10 yrs ago  Substance and Sexual Activity  . Alcohol use: Yes    Alcohol/week: 2.0 - 3.0 standard drinks    Types: 2 - 3 Glasses of wine per week    Comment: 2-3 drinks weekly    . Drug use: No  . Sexual activity: Not on file     ROS Constitutional: Denies fever, chills, weight loss/gain, headaches, insomnia,  night sweats or change in appetite. Does c/o fatigue. Eyes: Denies redness, blurred vision, diplopia, discharge, itchy or watery eyes.  ENT: Denies discharge, congestion, post nasal drip, epistaxis, sore throat, earache, hearing loss, dental pain, Tinnitus, Vertigo, Sinus pain or snoring.  Cardio: Denies chest pain, palpitations, irregular heartbeat, syncope, dyspnea, diaphoresis, orthopnea, PND, claudication or edema Respiratory: denies cough, dyspnea, DOE, pleurisy, hoarseness, laryngitis or wheezing.  Gastrointestinal: Denies dysphagia, heartburn, reflux, water brash, pain, cramps, nausea, vomiting, bloating, diarrhea, constipation, hematemesis, melena, hematochezia, jaundice or hemorrhoids Genitourinary: Denies dysuria, frequency, urgency, nocturia, hesitancy, discharge, hematuria or flank pain Musculoskeletal: Denies arthralgia, myalgia, stiffness, Jt. Swelling, pain, limp or strain/sprain. Denies Falls. Skin: Denies puritis, rash, hives, warts, acne, eczema or change in skin lesion Neuro: No weakness, tremor, incoordination, spasms, paresthesia or pain Psychiatric: Denies confusion, memory loss or sensory loss. Denies Depression. Endocrine: Denies change in weight, skin, hair change, nocturia, and paresthesia, diabetic polys, visual blurring or hyper / hypo glycemic episodes.  Heme/Lymph: No excessive bleeding, bruising or enlarged lymph nodes.  Physical Exam  There were no vitals taken for this visit.  General Appearance: Well nourished and well groomed and in no apparent distress.  Eyes: PERRLA, EOMs, conjunctiva no swelling or erythema, normal fundi and vessels. Sinuses: No frontal/maxillary tenderness ENT/Mouth: EACs patent / TMs  nl. Nares clear without erythema, swelling, mucoid exudates. Oral hygiene is good. No erythema, swelling, or exudate.  Tongue normal, non-obstructing. Tonsils not swollen or erythematous. Hearing normal.  Neck: Supple, thyroid not palpable. No bruits, nodes or JVD. Respiratory: Respiratory effort normal.  BS equal and clear bilateral without rales, rhonci, wheezing or stridor. Cardio: Heart sounds are normal with regular rate and rhythm and no murmurs, rubs or gallops. Peripheral pulses are normal and equal bilaterally without edema. No aortic or femoral bruits. Chest: symmetric with normal excursions and percussion.  Abdomen: Soft, with Nl bowel sounds. Nontender, no guarding, rebound, hernias, masses, or organomegaly.  Lymphatics: Non tender without lymphadenopathy.  Musculoskeletal: Full ROM all peripheral extremities, joint stability, 5/5 strength, and normal gait. Skin: Warm and dry without rashes, lesions, cyanosis, clubbing or  ecchymosis.  Neuro: Cranial nerves intact, reflexes equal bilaterally. Normal muscle tone, no cerebellar symptoms. Sensation intact.  Pysch: Alert and oriented X 3 with normal affect, insight and judgment appropriate.   Assessment and Plan  1. Annual Preventative/Screening Exam   2. Labile hypertension  - EKG 12-Lead - [place], RETROPERITNL ABD,  LTD - Urinalysis, Routine w reflex microscopic - Microalbumin / creatinine urine ratio - CBC  with Differential/Platelet - COMPLETE METABOLIC PANEL WITH GFR - Magnesium - TSH  3. Hyperlipidemia, mixed  - EKG 12-Lead - [place], RETROPERITNL ABD,  LTD - Lipid panel - TSH  4. Abnormal glucose  - EKG 12-Lead - [place], RETROPERITNL ABD,  LTD - Hemoglobin A1c - Insulin, random  5. Vitamin D deficiency  - VITAMIN D 25 Hydroxy  6. Prediabetes  - EKG 12-Lead - Hemoglobin A1c - Insulin, random  7. Testosterone deficiency  - Testosterone  8. Prostate cancer screening  - PSA  9. Screening for colorectal cancer  - POC Hemoccult Bld/Stl   10. Screening examination for pulmonary tuberculosis  - TB Skin Test  11. Screening  for ischemic heart disease  - EKG 12-Lead  12. Screening for AAA (aortic abdominal aneurysm)  - [place], RETROPERITNL ABD,  LTD  13. Fatigue, unspecified type  - Iron,Total/Total Iron Binding Cap - Vitamin B12 - CBC with Differential/Platelet - TSH  14. Medication management  - Urinalysis, Routine w reflex microscopic - Microalbumin / creatinine urine ratio - CBC with Differential/Platelet - COMPLETE METABOLIC PANEL WITH GFR - Magnesium - Lipid panel - TSH - Hemoglobin A1c - Insulin, random - VITAMIN D 25 Hydroxy        Patient was counseled in prudent diet, weight control to achieve/maintain BMI less than 25, BP monitoring, regular exercise and medications as discussed.  Discussed med effects and SE's. Routine screening labs and tests as requested with regular follow-up as recommended. Over 40 minutes of exam, counseling, chart review and high complex critical decision making was performed   Kirtland Bouchard, MD

## 2020-05-22 ENCOUNTER — Other Ambulatory Visit: Payer: Self-pay

## 2020-05-22 ENCOUNTER — Ambulatory Visit (INDEPENDENT_AMBULATORY_CARE_PROVIDER_SITE_OTHER): Payer: BC Managed Care – PPO | Admitting: Internal Medicine

## 2020-05-22 VITALS — BP 140/90 | HR 84 | Temp 97.2°F | Resp 16 | Ht 75.0 in | Wt 279.8 lb

## 2020-05-22 DIAGNOSIS — Z0000 Encounter for general adult medical examination without abnormal findings: Secondary | ICD-10-CM | POA: Diagnosis not present

## 2020-05-22 DIAGNOSIS — Z0001 Encounter for general adult medical examination with abnormal findings: Secondary | ICD-10-CM

## 2020-05-22 DIAGNOSIS — R7309 Other abnormal glucose: Secondary | ICD-10-CM

## 2020-05-22 DIAGNOSIS — R030 Elevated blood-pressure reading, without diagnosis of hypertension: Secondary | ICD-10-CM

## 2020-05-22 DIAGNOSIS — R350 Frequency of micturition: Secondary | ICD-10-CM | POA: Diagnosis not present

## 2020-05-22 DIAGNOSIS — Z79899 Other long term (current) drug therapy: Secondary | ICD-10-CM | POA: Diagnosis not present

## 2020-05-22 DIAGNOSIS — Z1329 Encounter for screening for other suspected endocrine disorder: Secondary | ICD-10-CM

## 2020-05-22 DIAGNOSIS — Z136 Encounter for screening for cardiovascular disorders: Secondary | ICD-10-CM

## 2020-05-22 DIAGNOSIS — E782 Mixed hyperlipidemia: Secondary | ICD-10-CM

## 2020-05-22 DIAGNOSIS — Z111 Encounter for screening for respiratory tuberculosis: Secondary | ICD-10-CM

## 2020-05-22 DIAGNOSIS — N401 Enlarged prostate with lower urinary tract symptoms: Secondary | ICD-10-CM

## 2020-05-22 DIAGNOSIS — Z1389 Encounter for screening for other disorder: Secondary | ICD-10-CM | POA: Diagnosis not present

## 2020-05-22 DIAGNOSIS — R7303 Prediabetes: Secondary | ICD-10-CM

## 2020-05-22 DIAGNOSIS — Z125 Encounter for screening for malignant neoplasm of prostate: Secondary | ICD-10-CM | POA: Diagnosis not present

## 2020-05-22 DIAGNOSIS — R0989 Other specified symptoms and signs involving the circulatory and respiratory systems: Secondary | ICD-10-CM

## 2020-05-22 DIAGNOSIS — R5383 Other fatigue: Secondary | ICD-10-CM

## 2020-05-22 DIAGNOSIS — E559 Vitamin D deficiency, unspecified: Secondary | ICD-10-CM | POA: Diagnosis not present

## 2020-05-22 DIAGNOSIS — Z130 Encounter for screening for diseases of the blood and blood-forming organs and certain disorders involving the immune mechanism: Secondary | ICD-10-CM

## 2020-05-22 DIAGNOSIS — Z131 Encounter for screening for diabetes mellitus: Secondary | ICD-10-CM | POA: Diagnosis not present

## 2020-05-22 DIAGNOSIS — Z1211 Encounter for screening for malignant neoplasm of colon: Secondary | ICD-10-CM

## 2020-05-22 DIAGNOSIS — E349 Endocrine disorder, unspecified: Secondary | ICD-10-CM

## 2020-05-22 DIAGNOSIS — Z13220 Encounter for screening for lipoid disorders: Secondary | ICD-10-CM

## 2020-05-23 LAB — CBC WITH DIFFERENTIAL/PLATELET
Absolute Monocytes: 683 cells/uL (ref 200–950)
Basophils Absolute: 36 cells/uL (ref 0–200)
Basophils Relative: 0.4 %
Eosinophils Absolute: 237 cells/uL (ref 15–500)
Eosinophils Relative: 2.6 %
HCT: 40.8 % (ref 38.5–50.0)
Hemoglobin: 13.6 g/dL (ref 13.2–17.1)
Lymphs Abs: 2129 cells/uL (ref 850–3900)
MCH: 29.3 pg (ref 27.0–33.0)
MCHC: 33.3 g/dL (ref 32.0–36.0)
MCV: 87.9 fL (ref 80.0–100.0)
MPV: 11.1 fL (ref 7.5–12.5)
Monocytes Relative: 7.5 %
Neutro Abs: 6015 cells/uL (ref 1500–7800)
Neutrophils Relative %: 66.1 %
Platelets: 316 10*3/uL (ref 140–400)
RBC: 4.64 10*6/uL (ref 4.20–5.80)
RDW: 12.9 % (ref 11.0–15.0)
Total Lymphocyte: 23.4 %
WBC: 9.1 10*3/uL (ref 3.8–10.8)

## 2020-05-23 LAB — LIPID PANEL
Cholesterol: 199 mg/dL (ref <200)
HDL: 43 mg/dL (ref >=40)
LDL Cholesterol (Calc): 120 mg/dL (calc) — ABNORMAL HIGH
Non-HDL Cholesterol (Calc): 156 mg/dL (calc) — ABNORMAL HIGH (ref <130)
Total CHOL/HDL Ratio: 4.6 (calc) (ref <5.0)
Triglycerides: 239 mg/dL — ABNORMAL HIGH (ref <150)

## 2020-05-23 LAB — URINALYSIS, ROUTINE W REFLEX MICROSCOPIC
Bilirubin Urine: NEGATIVE
Glucose, UA: NEGATIVE
Hgb urine dipstick: NEGATIVE
Ketones, ur: NEGATIVE
Leukocytes,Ua: NEGATIVE
Nitrite: NEGATIVE
Protein, ur: NEGATIVE
Specific Gravity, Urine: 1.025 (ref 1.001–1.03)
pH: 5.5 (ref 5.0–8.0)

## 2020-05-23 LAB — TESTOSTERONE: Testosterone: 145 ng/dL — ABNORMAL LOW (ref 250–827)

## 2020-05-23 LAB — COMPLETE METABOLIC PANEL WITH GFR
AG Ratio: 1.4 (calc) (ref 1.0–2.5)
ALT: 25 U/L (ref 9–46)
AST: 18 U/L (ref 10–35)
Albumin: 4.1 g/dL (ref 3.6–5.1)
Alkaline phosphatase (APISO): 95 U/L (ref 35–144)
BUN: 16 mg/dL (ref 7–25)
CO2: 29 mmol/L (ref 20–32)
Calcium: 9.5 mg/dL (ref 8.6–10.3)
Chloride: 103 mmol/L (ref 98–110)
Creat: 0.95 mg/dL (ref 0.70–1.25)
GFR, Est African American: 100 mL/min/{1.73_m2} (ref >=60)
GFR, Est Non African American: 87 mL/min/{1.73_m2} (ref >=60)
Globulin: 3 g/dL (calc) (ref 1.9–3.7)
Glucose, Bld: 94 mg/dL (ref 65–99)
Potassium: 4.3 mmol/L (ref 3.5–5.3)
Sodium: 140 mmol/L (ref 135–146)
Total Bilirubin: 0.4 mg/dL (ref 0.2–1.2)
Total Protein: 7.1 g/dL (ref 6.1–8.1)

## 2020-05-23 LAB — IRON, TOTAL/TOTAL IRON BINDING CAP
%SAT: 31 % (calc) (ref 20–48)
Iron: 92 ug/dL (ref 50–180)
TIBC: 293 mcg/dL (calc) (ref 250–425)

## 2020-05-23 LAB — TSH: TSH: 2.31 mIU/L (ref 0.40–4.50)

## 2020-05-23 LAB — PSA: PSA: 1.8 ng/mL (ref <=4.0)

## 2020-05-23 LAB — HEMOGLOBIN A1C
Hgb A1c MFr Bld: 5.2 % of total Hgb (ref <5.7)
Mean Plasma Glucose: 103 (calc)
eAG (mmol/L): 5.7 (calc)

## 2020-05-23 LAB — INSULIN, RANDOM: Insulin: 10.6 u[IU]/mL

## 2020-05-23 LAB — VITAMIN D 25 HYDROXY (VIT D DEFICIENCY, FRACTURES): Vit D, 25-Hydroxy: 53 ng/mL (ref 30–100)

## 2020-05-23 LAB — MICROALBUMIN / CREATININE URINE RATIO
Creatinine, Urine: 185 mg/dL (ref 20–320)
Microalb Creat Ratio: 2 mcg/mg creat (ref <30)
Microalb, Ur: 0.3 mg/dL

## 2020-05-23 LAB — VITAMIN B12: Vitamin B-12: 437 pg/mL (ref 200–1100)

## 2020-05-23 LAB — MAGNESIUM: Magnesium: 2.3 mg/dL (ref 1.5–2.5)

## 2020-05-25 ENCOUNTER — Other Ambulatory Visit: Payer: Self-pay | Admitting: Internal Medicine

## 2020-05-25 DIAGNOSIS — K219 Gastro-esophageal reflux disease without esophagitis: Secondary | ICD-10-CM

## 2020-05-30 LAB — TB SKIN TEST
Induration: 0 mm
TB Skin Test: NEGATIVE

## 2020-12-03 DIAGNOSIS — Z03818 Encounter for observation for suspected exposure to other biological agents ruled out: Secondary | ICD-10-CM | POA: Diagnosis not present

## 2021-01-01 ENCOUNTER — Ambulatory Visit (INDEPENDENT_AMBULATORY_CARE_PROVIDER_SITE_OTHER): Payer: Managed Care, Other (non HMO) | Admitting: Adult Health Nurse Practitioner

## 2021-01-01 ENCOUNTER — Other Ambulatory Visit: Payer: Self-pay

## 2021-01-01 ENCOUNTER — Encounter: Payer: Self-pay | Admitting: Adult Health Nurse Practitioner

## 2021-01-01 ENCOUNTER — Other Ambulatory Visit: Payer: Managed Care, Other (non HMO)

## 2021-01-01 VITALS — Temp 97.5°F

## 2021-01-01 DIAGNOSIS — R059 Cough, unspecified: Secondary | ICD-10-CM

## 2021-01-01 DIAGNOSIS — Z7189 Other specified counseling: Secondary | ICD-10-CM

## 2021-01-01 DIAGNOSIS — U071 COVID-19: Secondary | ICD-10-CM

## 2021-01-01 DIAGNOSIS — Z20822 Contact with and (suspected) exposure to covid-19: Secondary | ICD-10-CM

## 2021-01-01 DIAGNOSIS — J0140 Acute pansinusitis, unspecified: Secondary | ICD-10-CM

## 2021-01-01 MED ORDER — PROMETHAZINE-DM 6.25-15 MG/5ML PO SYRP: 5.0000 mL | ORAL_SOLUTION | Freq: Four times a day (QID) | ORAL | 1 refills | Status: DC | PRN

## 2021-01-01 MED ORDER — AZITHROMYCIN 250 MG PO TABS: ORAL_TABLET | ORAL | 1 refills | Status: AC

## 2021-01-01 MED ORDER — DEXAMETHASONE 4 MG PO TABS: ORAL_TABLET | ORAL | 1 refills | Status: DC

## 2021-01-01 NOTE — Patient Instructions (Signed)
   Today you had a telephone visit with Garnet Sierras, DNP.  Below is a summary of your visit.   We have sent in Azithromyacin, take two tablets when you get the prescription and then one tablet until complete.  A Dexamethasone steroid taper has been sent to the pharmacy for you to help reduce inflammation.  Start taking aspirin 81mg  aspirin daily.  At the pharmacy purchase and Oxygen sensor.  Goal is 93% or higher.  Make sure your finger is warm.  Nail polish may alter readings.  IF reading is less that 93% take a few deep breaths in through your nose and out your mouth to increase the readings.  **IF you are less than 90% and short of breath please SEEK IMMEDIATE ATTENTION via 911.**   Chest Congestion: Continue Taking the Mucinex DM every 12 hours.  You can try humidification or Warm steamy shower to help loosen chest.  Cough: You may take the Delsym cough syrup, be mindful of timing as there is a small amount of this in the Mucinex DM. We will also send in Promethazine cough syrup.  Sore throat For your sore throat, gargle salt water and spit out.  Throat lozengers or chloraseptic spray.   Runny nose, ear fullness Try  Cetirizine (Zyrtec)  Or levocetirazine (Xyzal) every night to help dry up any fluid draining in back of your throat.  IF this does not work try Human resources officer (Fexofenadine).  Use the antihistamine that best gives you the drying effect.   Nasal Congestion: Take Sudafed as directed.  If you take blood pressure medication, monitor your blood pressure as it can increase if taking this medication.  Body Aches:  You can take Acetaminophen (Tylenol-focuses on pain fever reduction) 1,000mg  every 8 hours alternating with Ibuprofen (Advil-focuses on inflammation, pain, fever) 600mg  every 6hours OR 800mg  every 8hours.   Continue to take Vitamin D supplement to help your immune system.   Stay hydrated drinking lots of water or some Gatorade.  IF your taste and smell is  absent, be mindful of eating salty food or adding salt to foods.  You will be able to taste salt OR the first to return so be careful as this can cause you to retain fluid and increase blood pressure.   Please let us know if you have any new or worsening symptoms or need any help with symptoms management.   Below is summary of CDC guidance for testing positive and for exposure.        Garnet Sierras, Laqueta Jean, DNP [facility] 01/01/2021  10:25 AM

## 2021-01-01 NOTE — Progress Notes (Signed)
Virtual Visit via Telephone Note  I connected with Blake Johnson on 01/01/21 at  4:00 PM EST by telephone and verified that I am speaking with the correct person using two identifiers.   I discussed the limitations, risks, security and privacy concerns of performing an evaluation and management service by telephone and the availability of in person appointments. I also discussed with the patient that there may be a patient responsible charge related to this service. The patient expressed understanding and agreed to proceed.   Assessment and Plan: Blake Johnson was seen today for phone visit, cough, fatigue, nasal congestion, headache, fever and hoarse.  Diagnoses and all orders for this visit:  Cough -     promethazine-dextromethorphan (PROMETHAZINE-DM) 6.25-15 MG/5ML syrup; Take 5 mLs by mouth 4 (four) times daily as needed for cough.  Acute non-recurrent pansinusitis -     azithromycin (ZITHROMAX) 250 MG tablet; Take 2 tablets (500 mg) on  Day 1,  followed by 1 tablet (250 mg) once daily on Days 2 through 5. -     dexamethasone (DECADRON) 4 MG tablet; Take 1 tab 3 x day - 3 days, then 2 x day - 3 days, then 1 tab daily  Discussed supportive care and quarantine with patient.   Patient education sent to MyChart acount.  Contact office with any new or worsening symptoms.  Follow Up Instructions:    I discussed the assessment and treatment plan with the patient. The patient was provided an opportunity to ask questions and all were answered. The patient agreed with the plan and demonstrated an understanding of the instructions.   The patient was advised to call back or seek an in-person evaluation if the symptoms worsen or if the condition fails to improve as anticipated.  I provided 30 minutes of non-face-to-face time during this encounter including counseling, chart review, and critical decision making was preformed.   Future Appointments  Date Time Provider [place]  05/26/2021   2:00 PM Unk Pinto, MD [facility] None      History of Present Illness:  60 y.o. male presents for sore throat symptoms and coughing that started 4 days ago.  He reports headache and some nausea that stated on Sun/Mon.   Cough is productive  He went to get COVID19 test today, PCR, Reports that he was feeling dizzy.  He had some water and gatorade while in line and that seemed to helped.  He reports he has been extremly fatigued.  He denies fever but has had low grade at times.  His voice is hoarse and reports headache.  He has been taking Dayquil and nightquil. For sinus drainage   He has increase his fluid intake.  Denies any oltagia, sore throat chest pains, shortness of breath or wheezing.  He has take a couple doses of mucinex tablets that has worked some.    Medical History:  Past Medical History:  Diagnosis Date   Diverticulosis of colon    mild  02/06/2011   GERD (gastroesophageal reflux disease)    Varicose veins     Current Medications:  Current Outpatient Medications on File Prior to Visit  Medication Sig   aspirin EC 81 MG tablet Take 81 mg by mouth daily.   Cholecalciferol (VITAMIN D PO) Take 5,000 Units by mouth 2 (two) times daily.   famotidine (PEPCID) 40 MG tablet TAKE 1 TABLET AT BEDTIME FOR ACID REFLUX   guaiFENesin (MUCINEX) 600 MG 12 hr tablet Take by mouth 2 (two) times daily.  Multiple Vitamin (MULTI-VITAMINS) TABS Take 1 tablet by mouth daily.   Multiple Vitamins-Minerals (ZINC PO) Take by mouth.   Zinc Chelated 50 MG TABS Take 50 mg by mouth daily.   No current facility-administered medications on file prior to visit.    Allergies:  No Known Allergies    Review of Systems:  Review of Systems  Constitutional: Positive for fever and malaise/fatigue. Negative for chills, diaphoresis and weight loss.  HENT: Positive for congestion and sinus pain. Negative for ear discharge, ear pain, hearing loss, nosebleeds, sore throat and  tinnitus.   Eyes: Negative for blurred vision, double vision and photophobia.  Respiratory: Positive for cough. Negative for hemoptysis, sputum production, shortness of breath, wheezing and stridor.   Cardiovascular: Negative for chest pain, palpitations, orthopnea, claudication, leg swelling and PND.  Gastrointestinal: Negative for abdominal pain, blood in stool, constipation, diarrhea, heartburn, melena, nausea and vomiting.  Genitourinary: Negative for dysuria, frequency and urgency.  Musculoskeletal: Negative for back pain, joint pain, myalgias and neck pain.  Skin: Negative for itching and rash.  Neurological: Positive for dizziness and headaches. Negative for tingling, tremors, sensory change and speech change.     Observations/Objective: General : Alert to discussion and questioning in no apparent distress HEENT: Hoarseness and dry cough noted for duration of visit Lungs: speaks in complete sentences, no audible wheezing, no apparent distress Neurological: alert, oriented x 3 Psychiatric: pleasant, judgement appropriate      Garnet Sierras, NP [facility] 01/01/2021  4:44 PM

## 2021-01-03 LAB — SARS-COV-2, NAA 2 DAY TAT

## 2021-01-03 LAB — NOVEL CORONAVIRUS, NAA: SARS-CoV-2, NAA: DETECTED — AB

## 2021-05-20 ENCOUNTER — Encounter: Payer: Self-pay | Admitting: Internal Medicine

## 2021-05-25 ENCOUNTER — Encounter: Payer: Self-pay | Admitting: Internal Medicine

## 2021-05-25 NOTE — Progress Notes (Signed)
Annual  Screening/Preventative Visit  & Comprehensive Evaluation & Examination  Future Appointments  Date Time Provider [place]  05/26/2021  2:00 PM Unk Pinto, MD [facility] None  05/26/2022  3:00 PM Unk Pinto, MD [facility] None            This very nice 60 y.o. MWM presents for a Screening /Preventative Visit & comprehensive evaluation and management of multiple medical co-morbidities.  Patient has been followed for labile HTN, HLD, Prediabetes and Vitamin D Deficiency. Patient also presents concerns over intermittent fine tremor of hands/thumbs , Lt > Rt. Denies FHx of tremors, Parkinson's, etc.       Labile HTN predates circa Jan 2017. Patient's BP has been controlled at home.  Today's BP is high normal 138/90.  Patient reports episodic sweating facial flushing and multiple random elevated BP's ranging from 140-180 / 88-98.  In 2017, patient had a negative Stress Cardiolite scan. Patient denies any cardiac symptoms as chest pain, palpitations, shortness of breath, dizziness or ankle swelling.       Patient's hyperlipidemia is not controlled with diet.  Last lipids were not at goal:  Lab Results  Component Value Date   CHOL 199 05/22/2020   HDL 43 05/22/2020   LDLCALC 120 (H) 05/22/2020   TRIG 239 (H) 05/22/2020   CHOLHDL 4.6 05/22/2020         Patient has Moderate Obesity (BMI 34.97) and consequent  prediabetes (A1c 5.8% /2017) and patient denies reactive hypoglycemic symptoms, visual blurring, diabetic polys or paresthesias. Last A1c was normal  & at goal:   Lab Results  Component Value Date   HGBA1C 5.2 05/22/2020                                  Patient has hx/o Low Testosterone ("171" /2017 - "122" /2018 and "173" /2019) and has  deferred replacement therapy.        Finally, patient has history of Vitamin D Deficiency ("26" /2017) and last vitamin D was slightly low (goal 70-100):   Lab Results  Component Value Date   VD25OH 53 05/22/2020      Current Outpatient Medications on File Prior to Visit  Medication Sig   aspirin EC 81 MG Take  daily.   VITAMIN D 5,000 Units Take   2 (two) times daily.   famotidine 40 MG  TAKE 1 TABLET AT BEDTIME    Multiple Vitamin  Take 1 tablet daily.   MultVit-Minerals Take   Zinc Chelated 50 MG  Take daily.    No Known Allergies   Past Medical History:  Diagnosis Date   Diverticulosis of colon    mild  02/06/2011   GERD (gastroesophageal reflux disease)    Varicose veins     Health Maintenance  Topic Date Due   Pneumococcal Vaccine 87-45 Years old (1 - PCV) Never done   Zoster Vaccines- Shingrix (1 of 2) Never done   COVID-19 Vaccine (4 - Booster for Pfizer series) 01/26/2021   INFLUENZA VACCINE  07/14/2021   COLONOSCOPY  08/21/2021   TETANUS/TDAP  12/30/2025   Hepatitis C Screening  Completed   HIV Screening  Completed   HPV VACCINES  Aged Out    Immunization History  Administered Date(s) Administered   Influenza, Quadrivalent, Recombinant, Inj, Pf 10/28/2019   Influenza,inj,Quad PF,6+ Mos 10/01/2018, 11/23/2020   Influenza-Unspecified 09/26/2016, 10/14/2017   PFIZER(Purple Top)SARS-COV-2 Vaccination 02/29/2020, 03/21/2020, 10/26/2020  PPD Test 12/31/2015, 01/05/2017, 02/03/2018, 04/12/2019, 05/22/2020   Tdap 12/31/2015    Last Colon - 08/21/2016 - Dr Fuller Plan - Recommended 5 yr f/u - due Sept 2022   Past Surgical History:  Procedure Laterality Date   BASAL CELL CARCINOMA EXCISION     removed from back,shoulder blade, and lower back   CHOLECYSTECTOMY     PILONIDAL CYST EXCISION     VARICOSE VEIN SURGERY     right leg     Family History  Problem Relation Age of Onset   Colon cancer Father    Cancer Father    Alzheimer's disease Mother    Colon cancer Paternal Aunt    Colon cancer Paternal Grandmother    Coronary artery disease Neg Hx     Social History   Socioeconomic History   Marital status: Married    Spouse name: Malachy Mood   Number of children: 2  sons & 2 daughters  Occupational History   Engineer  Tobacco Use   Smoking status: Never   Smokeless tobacco: Never   Tobacco comments:    10 yrs ago  Substance and Sexual Activity   Alcohol use: Yes    Alcohol/week: 2.0 - 3.0 standard drinks    Types: 2 - 3 Glasses of wine per week    Comment: 2-3 drinks weekly   Drug use: No   Sexual activity: Not on file     ROS Constitutional: Denies fever, chills, weight loss/gain, headaches, insomnia,  night sweats or change in appetite. Does c/o fatigue. Eyes: Denies redness, blurred vision, diplopia, discharge, itchy or watery eyes.  ENT: Denies discharge, congestion, post nasal drip, epistaxis, sore throat, earache, hearing loss, dental pain, Tinnitus, Vertigo, Sinus pain or snoring.  Cardio: Denies chest pain, palpitations, irregular heartbeat, syncope, dyspnea, diaphoresis, orthopnea, PND, claudication or edema Respiratory: denies cough, dyspnea, DOE, pleurisy, hoarseness, laryngitis or wheezing.  Gastrointestinal: Denies dysphagia, heartburn, reflux, water brash, pain, cramps, nausea, vomiting, bloating, diarrhea, constipation, hematemesis, melena, hematochezia, jaundice or hemorrhoids Genitourinary: Denies dysuria, frequency, urgency, nocturia, hesitancy, discharge, hematuria or flank pain Musculoskeletal: Denies arthralgia, myalgia, stiffness, Jt. Swelling, pain, limp or strain/sprain. Denies Falls. Skin: Denies puritis, rash, hives, warts, acne, eczema or change in skin lesion Neuro: No weakness, tremor, incoordination, spasms, paresthesia or pain Psychiatric: Denies confusion, memory loss or sensory loss. Denies Depression. Endocrine: Denies change in weight, skin, hair change, nocturia, and paresthesia, diabetic polys, visual blurring or hyper / hypo glycemic episodes.  Heme/Lymph: No excessive bleeding, bruising or enlarged lymph nodes.   Physical Exam  BP 138/90 (BP Location: Right Arm, Patient Position: Sitting, Cuff Size:  Large)   Pulse 88   Temp 97.7 F (36.5 C)   Resp 18   Ht 6\' 3"  (1.905 m)   Wt 283 lb (128.4 kg)   SpO2 98%   BMI 35.37 kg/m   General Appearance: Well nourished and well groomed and in no apparent distress.  Eyes: PERRLA, EOMs, conjunctiva no swelling or erythema, normal fundi and vessels. Sinuses: No frontal/maxillary tenderness ENT/Mouth: EACs patent / TMs  nl. Nares clear without erythema, swelling, mucoid exudates. Oral hygiene is good. No erythema, swelling, or exudate. Tongue normal, non-obstructing. Tonsils not swollen or erythematous. Hearing normal.  Neck: Supple, thyroid not palpable. No bruits, nodes or JVD. Respiratory: Respiratory effort normal.  BS equal and clear bilateral without rales, rhonci, wheezing or stridor. Cardio: Heart sounds are normal with regular rate and rhythm and no murmurs, rubs or gallops. Peripheral pulses are normal and equal  bilaterally without edema. No aortic or femoral bruits. Chest: symmetric with normal excursions and percussion.  Abdomen: Soft, with Nl bowel sounds. Nontender, no guarding, rebound, hernias, masses, or organomegaly.  Lymphatics: Non tender without lymphadenopathy.  Musculoskeletal: Full ROM all peripheral extremities, joint stability, 5/5 strength, and normal gait & arm swing. Skin: Warm and dry without rashes, lesions, cyanosis, clubbing or  ecchymosis.  Neuro: Cranial nerves intact, reflexes equal bilaterally. Normal muscle tone, no cog wheeling, no cerebellar symptoms. Sensation intact.  Fine high frequency low amplitude of thumbs at rest , L>R.  Pysch: Alert and oriented X 3 with normal affect, insight and judgment appropriate.   Assessment and Plan  1. Annual Preventative/Screening Exam    2. Labile hypertension  - EKG 12-Lead - [place], RETROPERITNL ABD,  LTD - Urinalysis, Routine w reflex microscopic - Microalbumin / creatinine urine ratio - CBC with Differential/Platelet - COMPLETE METABOLIC PANEL WITH GFR -  Magnesium - TSH  3. Hyperlipidemia, mixed  - EKG 12-Lead - [place], RETROPERITNL ABD,  LTD - Lipid panel - TSH  4. Abnormal glucose  - EKG 12-Lead - [place], RETROPERITNL ABD,  LTD - Hemoglobin A1c - Insulin, random  5. Vitamin D deficiency  - VITAMIN D 25 Hydroxy   6. Testosterone deficiency  - Testosterone  7. Prediabetes  - Lipid panel - TSH  8. Class 2 severe obesity due to excess calories with serious comorbidity  and body mass index (BMI) of 35.0 to 35.9 in adult (HCC)  - TSH  9. Prostate cancer screening  - PSA  10. Screening for colorectal cancer   11. Screening examination for pulmonary tuberculosis  - TB Skin Test  12. Screening for ischemic heart disease  - EKG 12-Lead  13. Screening for AAA (aortic abdominal aneurysm)  - [place], RETROPERITNL ABD,  LTD  14. Fatigue, unspecified type  - Iron, Total/Total Iron Binding Cap - Vitamin B12  15. Medication management  - Urinalysis, Routine w reflex microscopic - Microalbumin / creatinine urine ratio - CBC with Differential/Platelet - COMPLETE METABOLIC PANEL WITH GFR - Magnesium - Lipid panel - TSH - Hemoglobin A1c - Insulin, random - VITAMIN D 25 Hydroxy         Patient was counseled in prudent diet, weight control to achieve/maintain BMI less than 25, BP monitoring, regular exercise and medications as discussed.  Discussed med effects and SE's. Routine screening labs and tests as requested with regular follow-up as recommended.        Offered for patient to see a movement disorder Neurologist , but he declined for now.  Also with c/o snoring & Daytime excessive sleepiness , offered referral for sleep study which he also declined for now.   Over 40 minutes of exam, counseling, chart review and high complex critical decision making was performed   Kirtland Bouchard, MD

## 2021-05-25 NOTE — Patient Instructions (Signed)

## 2021-05-26 ENCOUNTER — Ambulatory Visit (INDEPENDENT_AMBULATORY_CARE_PROVIDER_SITE_OTHER): Payer: Managed Care, Other (non HMO) | Admitting: Internal Medicine

## 2021-05-26 ENCOUNTER — Other Ambulatory Visit: Payer: Self-pay

## 2021-05-26 ENCOUNTER — Encounter: Payer: Self-pay | Admitting: Internal Medicine

## 2021-05-26 VITALS — BP 138/90 | HR 88 | Temp 97.7°F | Resp 18 | Ht 75.0 in | Wt 283.0 lb

## 2021-05-26 DIAGNOSIS — Z1389 Encounter for screening for other disorder: Secondary | ICD-10-CM

## 2021-05-26 DIAGNOSIS — Z111 Encounter for screening for respiratory tuberculosis: Secondary | ICD-10-CM | POA: Diagnosis not present

## 2021-05-26 DIAGNOSIS — Z125 Encounter for screening for malignant neoplasm of prostate: Secondary | ICD-10-CM | POA: Diagnosis not present

## 2021-05-26 DIAGNOSIS — Z79899 Other long term (current) drug therapy: Secondary | ICD-10-CM

## 2021-05-26 DIAGNOSIS — R350 Frequency of micturition: Secondary | ICD-10-CM | POA: Diagnosis not present

## 2021-05-26 DIAGNOSIS — Z1329 Encounter for screening for other suspected endocrine disorder: Secondary | ICD-10-CM

## 2021-05-26 DIAGNOSIS — Z0000 Encounter for general adult medical examination without abnormal findings: Secondary | ICD-10-CM

## 2021-05-26 DIAGNOSIS — E559 Vitamin D deficiency, unspecified: Secondary | ICD-10-CM

## 2021-05-26 DIAGNOSIS — Z131 Encounter for screening for diabetes mellitus: Secondary | ICD-10-CM | POA: Diagnosis not present

## 2021-05-26 DIAGNOSIS — R7309 Other abnormal glucose: Secondary | ICD-10-CM

## 2021-05-26 DIAGNOSIS — Z1211 Encounter for screening for malignant neoplasm of colon: Secondary | ICD-10-CM

## 2021-05-26 DIAGNOSIS — Z6835 Body mass index (BMI) 35.0-35.9, adult: Secondary | ICD-10-CM

## 2021-05-26 DIAGNOSIS — E782 Mixed hyperlipidemia: Secondary | ICD-10-CM

## 2021-05-26 DIAGNOSIS — Z13220 Encounter for screening for lipoid disorders: Secondary | ICD-10-CM

## 2021-05-26 DIAGNOSIS — Z130 Encounter for screening for diseases of the blood and blood-forming organs and certain disorders involving the immune mechanism: Secondary | ICD-10-CM | POA: Diagnosis not present

## 2021-05-26 DIAGNOSIS — R5383 Other fatigue: Secondary | ICD-10-CM

## 2021-05-26 DIAGNOSIS — N401 Enlarged prostate with lower urinary tract symptoms: Secondary | ICD-10-CM

## 2021-05-26 DIAGNOSIS — R0989 Other specified symptoms and signs involving the circulatory and respiratory systems: Secondary | ICD-10-CM | POA: Diagnosis not present

## 2021-05-26 DIAGNOSIS — Z136 Encounter for screening for cardiovascular disorders: Secondary | ICD-10-CM

## 2021-05-26 DIAGNOSIS — E349 Endocrine disorder, unspecified: Secondary | ICD-10-CM

## 2021-05-26 DIAGNOSIS — Z0001 Encounter for general adult medical examination with abnormal findings: Secondary | ICD-10-CM

## 2021-05-26 DIAGNOSIS — R7303 Prediabetes: Secondary | ICD-10-CM

## 2021-05-26 MED ORDER — NADOLOL 40 MG PO TABS: ORAL_TABLET | ORAL | 11 refills | Status: DC

## 2021-05-27 DIAGNOSIS — Z111 Encounter for screening for respiratory tuberculosis: Secondary | ICD-10-CM | POA: Diagnosis not present

## 2021-05-27 LAB — CBC WITH DIFFERENTIAL/PLATELET
Absolute Monocytes: 506 cells/uL (ref 200–950)
Basophils Absolute: 64 cells/uL (ref 0–200)
Basophils Relative: 0.7 %
Eosinophils Absolute: 285 cells/uL (ref 15–500)
Eosinophils Relative: 3.1 %
HCT: 45.4 % (ref 38.5–50.0)
Hemoglobin: 14.6 g/dL (ref 13.2–17.1)
Lymphs Abs: 2226 cells/uL (ref 850–3900)
MCH: 29 pg (ref 27.0–33.0)
MCHC: 32.2 g/dL (ref 32.0–36.0)
MCV: 90.3 fL (ref 80.0–100.0)
MPV: 10.9 fL (ref 7.5–12.5)
Monocytes Relative: 5.5 %
Neutro Abs: 6118 cells/uL (ref 1500–7800)
Neutrophils Relative %: 66.5 %
Platelets: 326 10*3/uL (ref 140–400)
RBC: 5.03 10*6/uL (ref 4.20–5.80)
RDW: 12.6 % (ref 11.0–15.0)
Total Lymphocyte: 24.2 %
WBC: 9.2 10*3/uL (ref 3.8–10.8)

## 2021-05-27 LAB — HEMOGLOBIN A1C
Hgb A1c MFr Bld: 5.4 % of total Hgb (ref <5.7)
Mean Plasma Glucose: 108 mg/dL
eAG (mmol/L): 6 mmol/L

## 2021-05-27 LAB — MICROALBUMIN / CREATININE URINE RATIO
Creatinine, Urine: 166 mg/dL (ref 20–320)
Microalb Creat Ratio: 3 mcg/mg creat (ref <30)
Microalb, Ur: 0.5 mg/dL

## 2021-05-27 LAB — LIPID PANEL
Cholesterol: 206 mg/dL — ABNORMAL HIGH (ref <200)
HDL: 51 mg/dL (ref >=40)
LDL Cholesterol (Calc): 129 mg/dL (calc) — ABNORMAL HIGH
Non-HDL Cholesterol (Calc): 155 mg/dL (calc) — ABNORMAL HIGH (ref <130)
Total CHOL/HDL Ratio: 4 (calc) (ref <5.0)
Triglycerides: 146 mg/dL (ref <150)

## 2021-05-27 LAB — COMPLETE METABOLIC PANEL WITH GFR
AG Ratio: 1.4 (calc) (ref 1.0–2.5)
ALT: 29 U/L (ref 9–46)
AST: 20 U/L (ref 10–35)
Albumin: 4.3 g/dL (ref 3.6–5.1)
Alkaline phosphatase (APISO): 99 U/L (ref 35–144)
BUN: 14 mg/dL (ref 7–25)
CO2: 27 mmol/L (ref 20–32)
Calcium: 9.8 mg/dL (ref 8.6–10.3)
Chloride: 105 mmol/L (ref 98–110)
Creat: 1.03 mg/dL (ref 0.70–1.25)
GFR, Est African American: 90 mL/min/{1.73_m2} (ref >=60)
GFR, Est Non African American: 78 mL/min/{1.73_m2} (ref >=60)
Globulin: 3.1 g/dL (calc) (ref 1.9–3.7)
Glucose, Bld: 92 mg/dL (ref 65–99)
Potassium: 4.4 mmol/L (ref 3.5–5.3)
Sodium: 142 mmol/L (ref 135–146)
Total Bilirubin: 0.5 mg/dL (ref 0.2–1.2)
Total Protein: 7.4 g/dL (ref 6.1–8.1)

## 2021-05-27 LAB — URINALYSIS, ROUTINE W REFLEX MICROSCOPIC
Bilirubin Urine: NEGATIVE
Glucose, UA: NEGATIVE
Hgb urine dipstick: NEGATIVE
Ketones, ur: NEGATIVE
Leukocytes,Ua: NEGATIVE
Nitrite: NEGATIVE
Protein, ur: NEGATIVE
Specific Gravity, Urine: 1.022 (ref 1.001–1.035)
pH: <=5 (ref 5.0–8.0)

## 2021-05-27 LAB — VITAMIN B12: Vitamin B-12: 415 pg/mL (ref 200–1100)

## 2021-05-27 LAB — IRON, TOTAL/TOTAL IRON BINDING CAP
%SAT: 31 % (calc) (ref 20–48)
Iron: 96 ug/dL (ref 50–180)
TIBC: 309 mcg/dL (calc) (ref 250–425)

## 2021-05-27 LAB — TSH: TSH: 1.64 mIU/L (ref 0.40–4.50)

## 2021-05-27 LAB — TESTOSTERONE: Testosterone: 109 ng/dL — ABNORMAL LOW (ref 250–827)

## 2021-05-27 LAB — MAGNESIUM: Magnesium: 2.2 mg/dL (ref 1.5–2.5)

## 2021-05-27 LAB — INSULIN, RANDOM: Insulin: 23.9 u[IU]/mL — ABNORMAL HIGH

## 2021-05-27 LAB — PSA: PSA: 1.46 ng/mL (ref <=4.00)

## 2021-05-27 LAB — VITAMIN D 25 HYDROXY (VIT D DEFICIENCY, FRACTURES): Vit D, 25-Hydroxy: 48 ng/mL (ref 30–100)

## 2021-05-27 NOTE — Progress Notes (Signed)
============================================================ - Test results slightly outside the reference range are not unusual. If there is anything important, I will review this with you,  otherwise it is considered normal test values.  If you have further questions,  please do not hesitate to contact me at the office or via My Chart.  ============================================================ ============================================================  -  Iron levels are Normal / OK    -  Vitamin B12 =   415   Low   (Ideal or Goal Vit B12 is between 450 - 1,100)    Low Vit B12 may be associated with Anemia , Fatigue,   Peripheral Neuropathy, Dementia, "Brain Fog", & Depression  - Recommend take a sub-lingual form of Vitamin B12 tablet   1,000 to 5,000 mcg tab that you dissolve under your tongue /Daily   - Can get Baron Sane - best price at [employer] or on [employer] ============================================================ ============================================================  -  PSA - Low - Great  ============================================================ ============================================================  - Testosterone low   - be sure taking your Zinc which can help raise Testosterone levels naturally  - Also Losing weight AND exercising for 15-20 minutes  twice /day has                                                              been shown to raise testosterone levels  ============================================================ ============================================================  -  Total Chol = 206 - slightly elevated         (  Ideal or Goal is less than 180  )   - and   - Bad /Dangerous LDL Chol = 129 also too high          (  Ideal or Goal is less than 70)   - Elevated Chol increases risk for Heart Attack, Stroke, Kidney failure &                                                                  early age onset Vascular  Dementia   - Recommend a stricter low cholesterol diet   - Cholesterol only comes from animal sources  - ie. meat, dairy, egg yolks  - Eat all the vegetables you want.  - Avoid meat, especially red meat - Beef AND Pork .  - Avoid cheese & dairy - milk & ice cream.     - Cheese is the most concentrated form of trans-fats which  is the worst thing to clog up our arteries.   - Veggie cheese is OK which can be found in the fresh  produce section at [employer] or [employer] or [employer]  - Recommend you schedule a lab visit in 3 months to recheck cholesterol &  if not significantly improved , then recommend starting meds to lower cholesterol & risk for   for Heart Attack, Stroke, Kidney failure & Dementia  ============================================================ ============================================================  -  A1c - Normal - Great - No Diabetes ! ============================================================ ============================================================  -  Vitamin D = 48 - sl Low  - Vitamin D goal is between 70-100.   - Please  make sure that you are taking your Vitamin D 10,000 units /Daily   - It is very important as a natural anti-inflammatory and helping the  immune system protect against viral infections, like the Covid-19    helping hair, skin, and nails, as well as reducing stroke and  heart attack risk.   - It helps your bones and helps with mood.  - It also decreases numerous cancer risks so please  take it as directed.   - Low Vit D is associated with a 200-300% higher risk for  CANCER   and 200-300% higher risk for HEART   ATTACK  &  STROKE.    - It is also associated with higher death rate at younger ages,   autoimmune diseases like Rheumatoid arthritis, Lupus,  Multiple Sclerosis.     - Also many other serious conditions, like depression, Alzheimer's  Dementia, infertility, muscle aches, fatigue, fibromyalgia    - just to name a few. ============================================================ ============================================================  -  All Else - CBC - Kidneys - Electrolytes - Liver - Magnesium & Thyroid    - all  Normal / OK ============================================================ ============================================================

## 2021-07-04 ENCOUNTER — Other Ambulatory Visit: Payer: Self-pay | Admitting: Internal Medicine

## 2021-07-04 MED ORDER — PROMETHAZINE-DM 6.25-15 MG/5ML PO SYRP: ORAL_SOLUTION | ORAL | 1 refills | Status: DC

## 2021-07-04 MED ORDER — DEXAMETHASONE 4 MG PO TABS: ORAL_TABLET | ORAL | 0 refills | Status: DC

## 2021-07-04 MED ORDER — BENZONATATE 200 MG PO CAPS: ORAL_CAPSULE | ORAL | 1 refills | Status: DC

## 2021-07-15 ENCOUNTER — Other Ambulatory Visit: Payer: Self-pay | Admitting: Internal Medicine

## 2021-07-15 MED ORDER — DEXAMETHASONE 4 MG PO TABS: ORAL_TABLET | ORAL | 0 refills | Status: DC

## 2021-08-26 ENCOUNTER — Encounter: Payer: Self-pay | Admitting: Gastroenterology

## 2021-08-28 ENCOUNTER — Ambulatory Visit: Payer: Managed Care, Other (non HMO) | Admitting: Internal Medicine

## 2021-08-28 NOTE — Progress Notes (Signed)
   C  A  N  C  E  L  L  E  D                                                                                                                                                                This very nice 60 y.o.male presents for 3 month follow up with HTN, HLD, Pre-Diabetes and Vitamin D Deficiency.        Patient is treated for HTN & BP has been controlled at home. Today's  . Patient has had no complaints of any cardiac type chest pain, palpitations, dyspnea / orthopnea / PND, dizziness, claudication, or dependent edema.       Hyperlipidemia is controlled with diet & meds. Patient denies myalgias or other med SE's. Last Lipids were  Lab Results  Component Value Date   CHOL 206 (H) 05/26/2021   HDL 51 05/26/2021   LDLCALC 129 (H) 05/26/2021   TRIG 146 05/26/2021   CHOLHDL 4.0 05/26/2021    Also, the patient has history of T2_NIDDM PreDiabetes and has had no symptoms of reactive hypoglycemia, diabetic polys, paresthesias or visual blurring.  Last A1c was   Lab Results  Component Value Date   HGBA1C 5.4 05/26/2021    Further, the patient also has history of Vitamin D Deficiency and supplements vitamin D without any suspected side-effects. Last vitamin D was  Lab Results  Component Value Date   VD25OH 48 05/26/2021

## 2021-08-29 ENCOUNTER — Encounter: Payer: Self-pay | Admitting: Gastroenterology

## 2021-09-04 ENCOUNTER — Other Ambulatory Visit: Payer: Self-pay | Admitting: Gastroenterology

## 2021-09-04 ENCOUNTER — Encounter: Payer: Self-pay | Admitting: Gastroenterology

## 2021-09-04 ENCOUNTER — Other Ambulatory Visit: Payer: Self-pay

## 2021-09-04 ENCOUNTER — Ambulatory Visit (AMBULATORY_SURGERY_CENTER): Payer: Managed Care, Other (non HMO) | Admitting: *Deleted

## 2021-09-04 VITALS — Ht 74.0 in | Wt 280.0 lb

## 2021-09-04 DIAGNOSIS — Z86010 Personal history of colonic polyps: Secondary | ICD-10-CM

## 2021-09-04 MED ORDER — PEG-KCL-NACL-NASULF-NA ASC-C 100 G PO SOLR: 1.0000 | Freq: Once | ORAL | 0 refills | Status: AC

## 2021-09-04 NOTE — Progress Notes (Signed)
Virtual pre-visit completed over telephone.  Instructions forwarded through [place].  No egg or soy allergy known to patient  No issues known to pt with past sedation with any surgeries or procedures Patient denies ever being told they had issues or difficulty with intubation  No FH of Malignant Hyperthermia Pt is not on diet pills Pt is not on  home 02  Pt is not on blood thinners  Pt denies issues with constipation  No A fib or A flutter  EMMI video to pt or via [place] 19 guidelines implemented in [place] today with Pt and RN   Pt is fully vaccinated  for [employer] mailed to patient. Discussed with pt there will be an out-of-pocket cost for prep and that varies from $0 to 70 +  dollars   Due to the COVID-19 pandemic we are asking patients to follow certain guidelines.  Pt aware of COVID protocols and [facility] guidelines

## 2021-09-15 ENCOUNTER — Encounter: Payer: Self-pay | Admitting: *Deleted

## 2021-09-15 DIAGNOSIS — Z86010 Personal history of colonic polyps: Secondary | ICD-10-CM

## 2021-09-17 ENCOUNTER — Other Ambulatory Visit: Payer: Self-pay

## 2021-09-17 ENCOUNTER — Ambulatory Visit (INDEPENDENT_AMBULATORY_CARE_PROVIDER_SITE_OTHER): Payer: Managed Care, Other (non HMO) | Admitting: Internal Medicine

## 2021-09-17 VITALS — BP 140/84 | HR 63 | Temp 97.9°F | Resp 16 | Ht 75.0 in | Wt 284.2 lb

## 2021-09-17 DIAGNOSIS — E782 Mixed hyperlipidemia: Secondary | ICD-10-CM

## 2021-09-17 DIAGNOSIS — Z23 Encounter for immunization: Secondary | ICD-10-CM

## 2021-09-17 DIAGNOSIS — Z79899 Other long term (current) drug therapy: Secondary | ICD-10-CM | POA: Diagnosis not present

## 2021-09-17 DIAGNOSIS — R0989 Other specified symptoms and signs involving the circulatory and respiratory systems: Secondary | ICD-10-CM | POA: Diagnosis not present

## 2021-09-17 DIAGNOSIS — R7309 Other abnormal glucose: Secondary | ICD-10-CM | POA: Diagnosis not present

## 2021-09-17 DIAGNOSIS — E559 Vitamin D deficiency, unspecified: Secondary | ICD-10-CM | POA: Diagnosis not present

## 2021-09-17 DIAGNOSIS — Z6835 Body mass index (BMI) 35.0-35.9, adult: Secondary | ICD-10-CM

## 2021-09-17 NOTE — Patient Instructions (Signed)

## 2021-09-17 NOTE — Progress Notes (Signed)
Future Appointments  Date Time Provider [place]  09/17/2021  2:30 PM Unk Pinto, MD [facility] None  09/24/2021  8:00 AM Ladene Artist, MD [facility]  05/26/2022  3:00 PM Unk Pinto, MD [facility] None    History of Present Illness:       This very nice 60 y.o. MWM presents for 6  month follow up with HTN, HLD, Pre-Diabetes and Vitamin D Deficiency. Patient has GERD controlled on his Famotidine.        Patient is treated for HTN & BP has been controlled at home. Today's BP is at goal - 140/84. Patient has had no complaints of any cardiac type chest pain, palpitations, dyspnea / orthopnea / PND, dizziness, claudication, or dependent edema.       Hyperlipidemia is not controlled with diet & meds. Patient denies myalgias or other med SE's. Last Lipids were not at goal:  Lab Results  Component Value Date   CHOL 206 (H) 05/26/2021   HDL 51 05/26/2021   LDLCALC 129 (H) 05/26/2021   TRIG 146 05/26/2021   CHOLHDL 4.0 05/26/2021     Also, the patient has history of PreDiabetes (A1c 5.8% /2017) and has had no symptoms of reactive hypoglycemia, diabetic polys, paresthesias or visual blurring.  Last A1c was normal & at goal:  Lab Results  Component Value Date   HGBA1C 5.4 05/26/2021        Further, the patient also has history of Vitamin D Deficiency ("26" /2017) and supplements vitamin D without any suspected side-effects. Last vitamin D was still low:   Lab Results  Component Value Date   VD25OH 48 05/26/2021     Current Outpatient Medications on File Prior to Visit  Medication Sig   VITAMIN D  5,000 Units  Take  2  times daily.   famotidine 40 MG tablet TAKE 1 TABLET AT BEDTIME    Multiple Vitamin  Take 1 tablet daily.   ZINC  Take daily   nadolol 40 MG tablet Take  1/2 to 1 tablet  every Morning  for BP   Zinc 50 MG TABS Take daily.    No Known Allergies   PMHx:   Past Medical History:  Diagnosis Date   Diverticulosis of colon     mild  02/06/2011   GERD (gastroesophageal reflux disease)    Hypertension    Varicose veins      Immunization History  Administered Date(s) Administered   Influenza, Quadrivalent 10/28/2019   Influenza,inj,Quad 10/01/2018, 11/23/2020   Influenza 09/26/2016, 10/14/2017   PFIZER SARS-COV-2 Vacc 02/29/2020, 03/21/2020, 10/26/2020   PPD Test 04/12/2019, 05/22/2020, 05/27/2021   Tdap 12/31/2015     Past Surgical History:  Procedure Laterality Date   BASAL CELL CARCINOMA EXCISION     removed from back,shoulder blade, and lower back   CHOLECYSTECTOMY     COLONOSCOPY     PILONIDAL CYST EXCISION     VARICOSE VEIN SURGERY     right leg    FHx:    Reviewed / unchanged  SHx:    Reviewed / unchanged   Systems Review:  Constitutional: Denies fever, chills, wt changes, headaches, insomnia, fatigue, night sweats, change in appetite. Eyes: Denies redness, blurred vision, diplopia, discharge, itchy, watery eyes.  ENT: Denies discharge, congestion, post nasal drip, epistaxis, sore throat, earache, hearing loss, dental pain, tinnitus, vertigo, sinus pain, snoring.  CV: Denies chest pain, palpitations, irregular heartbeat, syncope, dyspnea, diaphoresis, orthopnea, PND, claudication or edema. Respiratory: denies cough, dyspnea, DOE,  pleurisy, hoarseness, laryngitis, wheezing.  Gastrointestinal: Denies dysphagia, odynophagia, heartburn, reflux, water brash, abdominal pain or cramps, nausea, vomiting, bloating, diarrhea, constipation, hematemesis, melena, hematochezia  or hemorrhoids. Genitourinary: Denies dysuria, frequency, urgency, nocturia, hesitancy, discharge, hematuria or flank pain. Musculoskeletal: Denies arthralgias, myalgias, stiffness, jt. swelling, pain, limping or strain/sprain.  Skin: Denies pruritus, rash, hives, warts, acne, eczema or change in skin lesion(s). Neuro: No weakness, tremor, incoordination, spasms, paresthesia or pain. Psychiatric: Denies confusion, memory loss or  sensory loss. Endo: Denies change in weight, skin or hair change.  Heme/Lymph: No excessive bleeding, bruising or enlarged lymph nodes.  Physical Exam  BP 140/84   Pulse 63   Temp 97.9 F (36.6 C)   Resp 16   Ht 6\' 3"  (1.905 m)   Wt 284 lb 3.2 oz (128.9 kg)   SpO2 97%   BMI 35.52 kg/m   Appears  well nourished, well groomed  and in no distress.  Eyes: PERRLA, EOMs, conjunctiva no swelling or erythema. Sinuses: No frontal/maxillary tenderness ENT/Mouth: EAC's clear, TM's nl w/o erythema, bulging. Nares clear w/o erythema, swelling, exudates. Oropharynx clear without erythema or exudates. Oral hygiene is good. Tongue normal, non obstructing. Hearing intact.  Neck: Supple. Thyroid not palpable. Car 2+/2+ without bruits, nodes or JVD. Chest: Respirations nl with BS clear & equal w/o rales, rhonchi, wheezing or stridor.  Cor: Heart sounds normal w/ regular rate and rhythm without sig. murmurs, gallops, clicks or rubs. Peripheral pulses normal and equal  without edema.  Abdomen: Soft & bowel sounds normal. Non-tender w/o guarding, rebound, hernias, masses or organomegaly.  Lymphatics: Unremarkable.  Musculoskeletal: Full ROM all peripheral extremities, joint stability, 5/5 strength and normal gait.  Skin: Warm, dry without exposed rashes, lesions or ecchymosis apparent.  Neuro: Cranial nerves intact, reflexes equal bilaterally. Sensory-motor testing grossly intact. Tendon reflexes grossly intact.  Pysch: Alert & oriented x 3.  Insight and judgement nl & appropriate. No ideations.  Assessment and Plan:  1. Labile hypertension  - Continue medication, monitor blood pressure at home.  - Continue DASH diet.  Reminder to go to the ER if any CP,  SOB, nausea, dizziness, severe HA, changes vision/speech.   - CBC with Differential/Platelet - COMPLETE METABOLIC PANEL WITH GFR - Magnesium - TSH  2. Hyperlipidemia, mixed  - Continue diet/meds, exercise,& lifestyle modifications.  -  Continue monitor periodic cholesterol/liver & renal functions    - Lipid panel  3. Abnormal glucose  - Continue diet, exercise  - Lifestyle modifications.  - Monitor appropriate labs   - Hemoglobin A1c - Insulin, random  4. Vitamin D deficiency  - Continue supplementation.   - VITAMIN D 25 Hydroxy   5. Class 2 severe obesity due to excess calories with serious  comorbidity and body mass index (BMI) of 35.0 to 35.9 in adult (HCC)  - TSH  6. Medication management  - CBC with Differential/Platelet - COMPLETE METABOLIC PANEL WITH GFR - Magnesium - Lipid panel - TSH - Hemoglobin A1c - Insulin, random - VITAMIN D 25 Hydroxy  7. Need for Tdap vaccination  - Tdap vaccine greater than or equal to 7yo IM         Discussed  regular exercise, BP monitoring, weight control to achieve/maintain BMI less than 25 and discussed med and SE's. Recommended labs to assess and monitor clinical status with further disposition pending results of labs.  I discussed the assessment and treatment plan with the patient. The patient was provided an opportunity to ask questions and all  were answered. The patient agreed with the plan and demonstrated an understanding of the instructions.  I provided over 30 minutes of exam, counseling, chart review and  complex critical decision making.        The patient was advised to call back or seek an in-person evaluation if the symptoms worsen or if the condition fails to improve as anticipated.   Kirtland Bouchard, MD

## 2021-09-18 ENCOUNTER — Other Ambulatory Visit: Payer: Self-pay | Admitting: Internal Medicine

## 2021-09-18 LAB — COMPLETE METABOLIC PANEL WITH GFR
AG Ratio: 1.4 (calc) (ref 1.0–2.5)
ALT: 20 U/L (ref 9–46)
AST: 17 U/L (ref 10–35)
Albumin: 4.3 g/dL (ref 3.6–5.1)
Alkaline phosphatase (APISO): 101 U/L (ref 35–144)
BUN/Creatinine Ratio: 10 (calc) (ref 6–22)
BUN: 14 mg/dL (ref 7–25)
CO2: 30 mmol/L (ref 20–32)
Calcium: 9.6 mg/dL (ref 8.6–10.3)
Chloride: 101 mmol/L (ref 98–110)
Creat: 1.44 mg/dL — ABNORMAL HIGH (ref 0.70–1.35)
Globulin: 3 g/dL (calc) (ref 1.9–3.7)
Glucose, Bld: 97 mg/dL (ref 65–139)
Potassium: 4.4 mmol/L (ref 3.5–5.3)
Sodium: 140 mmol/L (ref 135–146)
Total Bilirubin: 0.5 mg/dL (ref 0.2–1.2)
Total Protein: 7.3 g/dL (ref 6.1–8.1)
eGFR: 55 mL/min/{1.73_m2} — ABNORMAL LOW (ref >=60)

## 2021-09-18 LAB — CBC WITH DIFFERENTIAL/PLATELET
Absolute Monocytes: 634 cells/uL (ref 200–950)
Basophils Absolute: 38 cells/uL (ref 0–200)
Basophils Relative: 0.4 %
Eosinophils Absolute: 211 cells/uL (ref 15–500)
Eosinophils Relative: 2.2 %
HCT: 42.5 % (ref 38.5–50.0)
Hemoglobin: 14 g/dL (ref 13.2–17.1)
Lymphs Abs: 2285 cells/uL (ref 850–3900)
MCH: 29.5 pg (ref 27.0–33.0)
MCHC: 32.9 g/dL (ref 32.0–36.0)
MCV: 89.5 fL (ref 80.0–100.0)
MPV: 11 fL (ref 7.5–12.5)
Monocytes Relative: 6.6 %
Neutro Abs: 6432 cells/uL (ref 1500–7800)
Neutrophils Relative %: 67 %
Platelets: 291 10*3/uL (ref 140–400)
RBC: 4.75 10*6/uL (ref 4.20–5.80)
RDW: 12.9 % (ref 11.0–15.0)
Total Lymphocyte: 23.8 %
WBC: 9.6 10*3/uL (ref 3.8–10.8)

## 2021-09-18 LAB — LIPID PANEL
Cholesterol: 217 mg/dL — ABNORMAL HIGH (ref <200)
HDL: 44 mg/dL (ref >=40)
LDL Cholesterol (Calc): 135 mg/dL (calc) — ABNORMAL HIGH
Non-HDL Cholesterol (Calc): 173 mg/dL (calc) — ABNORMAL HIGH (ref <130)
Total CHOL/HDL Ratio: 4.9 (calc) (ref <5.0)
Triglycerides: 235 mg/dL — ABNORMAL HIGH (ref <150)

## 2021-09-18 LAB — VITAMIN D 25 HYDROXY (VIT D DEFICIENCY, FRACTURES): Vit D, 25-Hydroxy: 46 ng/mL (ref 30–100)

## 2021-09-18 LAB — HEMOGLOBIN A1C
Hgb A1c MFr Bld: 5.3 % of total Hgb (ref <5.7)
Mean Plasma Glucose: 105 mg/dL
eAG (mmol/L): 5.8 mmol/L

## 2021-09-18 LAB — TSH: TSH: 2.08 mIU/L (ref 0.40–4.50)

## 2021-09-18 LAB — INSULIN, RANDOM: Insulin: 37.5 u[IU]/mL — ABNORMAL HIGH

## 2021-09-18 LAB — MAGNESIUM: Magnesium: 2.2 mg/dL (ref 1.5–2.5)

## 2021-09-18 MED ORDER — ROSUVASTATIN CALCIUM 10 MG PO TABS: ORAL_TABLET | ORAL | 3 refills | Status: DC

## 2021-09-18 MED ORDER — PEG 3350-KCL-NA BICARB-NACL 420 G PO SOLR: 4000.0000 mL | Freq: Once | ORAL | 0 refills | Status: AC

## 2021-09-18 NOTE — Progress Notes (Signed)
============================================================ - Test results slightly outside the reference range are not unusual. If there is anything important, I will review this with you,  otherwise it is considered normal test values.  If you have further questions,  please do not hesitate to contact me at the office or via My Chart.  ============================================================ ============================================================  -  Creatinine is a little elevated & GFR = filtration flow rate of kidneys is slightly  decreased  which means   - Kidney functions look a little dehydrated    Very important to drink adequate amounts of fluids to prevent permanent damage    - Recommend drink at least 6 bottles (16 ounces) of fluids /water /day = 96 Oz ~100 oz  - 100 oz = 3,000 cc or 3 liters / day  - >> That's 1 &1/2 bottles of a 2 liter soda bottle /day !  ============================================================ ============================================================  - Total Chol = 217 - > too high    &    - Bad / Dangerous LDL Chol = 135 ->  way too high   -  Will  send in Rx for very low dose Crestor (Rosuvastatin) to Prevent                                    Heart Attack, Stroke, Kidney Failure & Vascular Dementia  ============================================================ ============================================================  - Also Triglycerides (   235   ) or fats in blood are too high  (goal is less than 150)    - Recommend avoid fried & greasy foods,  sweets / candy,   - Avoid white rice  (brown or wild rice or Quinoa is OK),   - Avoid white potatoes  (sweet potatoes are OK)   - Avoid anything made from white flour  - bagels, doughnuts, rolls, buns, biscuits, white and   wheat breads, pizza crust and traditional  pasta made of white flour & egg white  - (vegetarian pasta or spinach or wheat pasta is OK).    -  Multi-grain bread is OK - like multi-grain flat bread or  sandwich thins.   - Avoid alcohol in excess.   - Exercise is also important. ============================================================ ============================================================  - A1c - Normal     -     No Diabetes   -     Great ! ============================================================ ============================================================  - Vitamin D = 46 - Very low   - Vitamin D goal is between 70-100.   - Please make sure that you are taking your Vitamin D  10,000 u/day as directed.   - It is very important as a natural anti-inflammatory and helping the  immune system protect against viral infections, like the Covid-19    helping hair, skin, and nails, as well as reducing stroke and  heart attack risk.   - It helps your bones and helps with mood.  - It also decreases numerous cancer risks so please  take it as directed.   - Low Vit D is associated with a 200-300% higher risk for  CANCER   and 200-300% higher risk for HEART   ATTACK  &  STROKE.    - It is also associated with higher death rate at younger ages,   autoimmune diseases like Rheumatoid arthritis, Lupus,  Multiple Sclerosis.     - Also many other serious conditions, like depression, Alzheimer's  Dementia, infertility, muscle aches, fatigue, fibromyalgia   -  just to name a few. ============================================================ ============================================================  - All Else - CBC - Kidneys - Electrolytes - Liver - Magnesium & Thyroid    - all  Normal / OK ============================================================ ============================================================  -

## 2021-09-20 ENCOUNTER — Encounter: Payer: Self-pay | Admitting: Internal Medicine

## 2021-09-24 ENCOUNTER — Encounter: Payer: Self-pay | Admitting: Gastroenterology

## 2021-09-24 ENCOUNTER — Ambulatory Visit (AMBULATORY_SURGERY_CENTER): Payer: Managed Care, Other (non HMO) | Admitting: Gastroenterology

## 2021-09-24 ENCOUNTER — Other Ambulatory Visit: Payer: Self-pay

## 2021-09-24 VITALS — BP 131/77 | HR 64 | Temp 97.1°F | Resp 18 | Ht 75.0 in | Wt 280.0 lb

## 2021-09-24 DIAGNOSIS — D120 Benign neoplasm of cecum: Secondary | ICD-10-CM

## 2021-09-24 DIAGNOSIS — D123 Benign neoplasm of transverse colon: Secondary | ICD-10-CM

## 2021-09-24 DIAGNOSIS — D125 Benign neoplasm of sigmoid colon: Secondary | ICD-10-CM

## 2021-09-24 DIAGNOSIS — Z800 Family history of malignant neoplasm of digestive organs: Secondary | ICD-10-CM | POA: Diagnosis not present

## 2021-09-24 DIAGNOSIS — Z86010 Personal history of colonic polyps: Secondary | ICD-10-CM | POA: Diagnosis present

## 2021-09-24 MED ORDER — SODIUM CHLORIDE 0.9 % IV SOLN: 500.0000 mL | Freq: Once | INTRAVENOUS | Status: DC

## 2021-09-24 NOTE — Op Note (Signed)
[place] Patient Name: Blake Johnson Procedure Date: 09/24/2021 7:58 AM MRN: [id number] Endoscopist: Ladene Artist , MD Age: 60 Referring MD:  Date of Birth: 11-Dec-1961 Gender: Male Account #: [id number] Procedure:                Colonoscopy Indications:              Surveillance: Personal history of adenomatous                            polyps on last colonoscopy 5 years ago. Family                            history of colon cancer, first degree relative. Medicines:                Monitored Anesthesia Care Procedure:                Pre-Anesthesia Assessment:                           - Prior to the procedure, a History and Physical                            was performed, and patient medications and                            allergies were reviewed. The patient's tolerance of                            previous anesthesia was also reviewed. The risks                            and benefits of the procedure and the sedation                            options and risks were discussed with the patient.                            All questions were answered, and informed consent                            was obtained. Prior Anticoagulants: The patient has                            taken no previous anticoagulant or antiplatelet                            agents. ASA Grade Assessment: II - A patient with                            mild systemic disease. After reviewing the risks                            and benefits, the patient was deemed in  satisfactory condition to undergo the procedure.                           After obtaining informed consent, the colonoscope                            was passed under direct vision. Throughout the                            procedure, the patient's blood pressure, pulse, and                            oxygen saturations were monitored continuously. The                            Olympus CF-HQ190L  ([id number]) Colonoscope was                            introduced through the anus and advanced to the the                            cecum, identified by appendiceal orifice and                            ileocecal valve. The ileocecal valve, appendiceal                            orifice, and rectum were photographed. The quality                            of the bowel preparation was excellent. The                            colonoscopy was performed without difficulty. The                            patient tolerated the procedure well. Scope In: 8:04:53 AM Scope Out: 8:21:44 AM Scope Withdrawal Time: 0 hours 13 minutes 50 seconds  Total Procedure Duration: 0 hours 16 minutes 51 seconds  Findings:                 The perianal and digital rectal examinations were                            normal.                           Four sessile polyps were found in the sigmoid                            colon, transverse colon, cecum and ileocecal valve.                            The polyps were 5 to 8 mm in size. These polyps  were removed with a cold snare. Resection and                            retrieval were complete.                           Multiple medium-mouthed diverticula were found in                            the left colon. There was no evidence of                            diverticular bleeding.                           Internal hemorrhoids were found during                            retroflexion. The hemorrhoids were small and Grade                            I (internal hemorrhoids that do not prolapse).                           The exam was otherwise without abnormality on                            direct and retroflexion views. Complications:            No immediate complications. Estimated blood loss:                            None. Estimated Blood Loss:     Estimated blood loss: none. Impression:               - Four 5 to 8 mm polyps in  the sigmoid colon, in                            the transverse colon, in the cecum and at the                            ileocecal valve, removed with a cold snare.                            Resected and retrieved.                           - Mild diverticulosis in the left colon.                           - Internal hemorrhoids.                           - The examination was otherwise normal on direct  and retroflexion views. Recommendation:           - Repeat colonoscopy after studies are complete for                            surveillance based on pathology results.                           - Patient has a contact number available for                            emergencies. The signs and symptoms of potential                            delayed complications were discussed with the                            patient. Return to normal activities tomorrow.                            Written discharge instructions were provided to the                            patient.                           - Resume previous diet.                           - Continue present medications.                           - Await pathology results. Ladene Artist, MD 09/24/2021 8:31:31 AM This report has been signed electronically.

## 2021-09-24 NOTE — Progress Notes (Signed)
Report given to PACU, vss 

## 2021-09-24 NOTE — Patient Instructions (Signed)
Handouts given for polyps, hemorrhoids and diverticulosis. Await pathology results. Resume regular medications and diet today.   YOU HAD AN ENDOSCOPIC PROCEDURE TODAY AT [facility]:   Refer to the procedure report that was given to you for any specific questions about what was found during the examination.  If the procedure report does not answer your questions, please call your gastroenterologist to clarify.  If you requested that your care partner not be given the details of your procedure findings, then the procedure report has been included in a sealed envelope for you to review at your convenience later.  YOU SHOULD EXPECT: Some feelings of bloating in the abdomen. Passage of more gas than usual.  Walking can help get rid of the air that was put into your GI tract during the procedure and reduce the bloating. If you had a lower endoscopy (such as a colonoscopy or flexible sigmoidoscopy) you may notice spotting of blood in your stool or on the toilet paper. If you underwent a bowel prep for your procedure, you may not have a normal bowel movement for a few days.  Please Note:  You might notice some irritation and congestion in your nose or some drainage.  This is from the oxygen used during your procedure.  There is no need for concern and it should clear up in a day or so.  SYMPTOMS TO REPORT IMMEDIATELY:  Following lower endoscopy (colonoscopy or flexible sigmoidoscopy):  Excessive amounts of blood in the stool  Significant tenderness or worsening of abdominal pains  Swelling of the abdomen that is new, acute  Fever of 100F or higher For urgent or emergent issues, a gastroenterologist can be reached at any hour by calling [phone]. Do not use MyChart messaging for urgent concerns.    DIET:  We do recommend a small meal at first, but then you may proceed to your regular diet.  Drink plenty of fluids but you should avoid alcoholic beverages for 24  hours.  ACTIVITY:  You should plan to take it easy for the rest of today and you should NOT DRIVE or use heavy machinery until tomorrow (because of the sedation medicines used during the test).    FOLLOW UP: Our staff will call the number listed on your records 48-72 hours following your procedure to check on you and address any questions or concerns that you may have regarding the information given to you following your procedure. If we do not reach you, we will leave a message.  We will attempt to reach you two times.  During this call, we will ask if you have developed any symptoms of COVID 19. If you develop any symptoms (ie: fever, flu-like symptoms, shortness of breath, cough etc.) before then, please call [phone].  If you test positive for Covid 19 in the 2 weeks post procedure, please call and report this information to [place].    If any biopsies were taken you will be contacted by phone or by letter within the next 1-3 weeks.  Please call us at [phone] if you have not heard about the biopsies in 3 weeks.    SIGNATURES/CONFIDENTIALITY: You and/or your care partner have signed paperwork which will be entered into your electronic medical record.  These signatures attest to the fact that that the information above on your After Visit Summary has been reviewed and is understood.  Full responsibility of the confidentiality of this discharge information lies with you and/or your care-partner.

## 2021-09-24 NOTE — Progress Notes (Signed)
Data will not transfer from monitor, vs being posted manually.   

## 2021-09-24 NOTE — Progress Notes (Signed)
History & Physical  Primary Care Physician:  Unk Pinto, MD Primary Gastroenterologist: Lucio Edward, MD  CHIEF COMPLAINT:   Personal history of colon polyps, family history of colon cancer  HPI: Blake Johnson is a 60 y.o. male with a history of adenomatous colon polyps and family history of colon cancer in a first-degree relative presenting for colonoscopy.  No active gastrointestinal complaints.     Past Medical History:  Diagnosis Date   Diverticulosis of colon    mild  02/06/2011   GERD (gastroesophageal reflux disease)    Hypertension    Varicose veins     Past Surgical History:  Procedure Laterality Date   BASAL CELL CARCINOMA EXCISION     removed from back,shoulder blade, and lower back   CHOLECYSTECTOMY     COLONOSCOPY     PILONIDAL CYST EXCISION     VARICOSE VEIN SURGERY     right leg    Prior to Admission medications   Medication Sig Start Date End Date Taking? Authorizing Provider  Cholecalciferol (VITAMIN D PO) Take 5,000 Units by mouth 2 (two) times daily.   Yes [provider]  Multiple Vitamin (MULTI-VITAMINS) TABS Take 1 tablet by mouth daily.   Yes [provider]  nadolol (CORGARD) 40 MG tablet Take  1/2 to 1 tablet  every Morning  for BP 05/26/21  Yes Unk Pinto, MD  Zinc Chelated 50 MG TABS Take 50 mg by mouth daily.   Yes [provider]  famotidine (PEPCID) 40 MG tablet TAKE 1 TABLET AT BEDTIME FOR ACID REFLUX Patient not taking: Reported on 09/24/2021 05/25/20   Unk Pinto, MD  rosuvastatin (CRESTOR) 10 MG tablet Take 1 tablet Daily for Cholesterol 09/18/21   Unk Pinto, MD    Current Outpatient Medications  Medication Sig Dispense Refill   Cholecalciferol (VITAMIN D PO) Take 5,000 Units by mouth 2 (two) times daily.     Multiple Vitamin (MULTI-VITAMINS) TABS Take 1 tablet by mouth daily.     nadolol (CORGARD) 40 MG tablet Take  1/2 to 1 tablet  every Morning  for BP 30 tablet 11   Zinc Chelated  50 MG TABS Take 50 mg by mouth daily.     famotidine (PEPCID) 40 MG tablet TAKE 1 TABLET AT BEDTIME FOR ACID REFLUX (Patient not taking: Reported on 09/24/2021) 90 tablet 3   rosuvastatin (CRESTOR) 10 MG tablet Take 1 tablet Daily for Cholesterol 90 tablet 3   Current Facility-Administered Medications  Medication Dose Route Frequency Provider Last Rate Last Admin   0.9 %  sodium chloride infusion  500 mL Intravenous Once Ladene Artist, MD        Allergies as of 09/24/2021   (No Known Allergies)    Family History  Problem Relation Age of Onset   Alzheimer's disease Mother    Colon cancer Father    Cancer Father    Colon cancer Paternal Aunt    Colon cancer Paternal Grandmother    Coronary artery disease Neg Hx    Esophageal cancer Neg Hx    Rectal cancer Neg Hx    Stomach cancer Neg Hx     Social History   Socioeconomic History   Marital status: Married    Spouse name: Not on file   Number of children: Not on file   Years of education: Not on file   Highest education level: Not on file  Occupational History   Not on file  Tobacco Use   Smoking status: Never  Smokeless tobacco: Never   Tobacco comments:    10 yrs ago  Substance and Sexual Activity   Alcohol use: Yes    Alcohol/week: 2.0 - 3.0 standard drinks    Types: 2 - 3 Glasses of wine per week    Comment: 2-3 drinks weekly   Drug use: No   Sexual activity: Not on file  Other Topics Concern   Not on file  Social History Narrative   Not on file   Social Determinants of Health   Financial Resource Strain: Not on file  Food Insecurity: Not on file  Transportation Needs: Not on file  Physical Activity: Not on file  Stress: Not on file  Social Connections: Not on file  Intimate Partner Violence: Not on file    Review of Systems:  All systems reviewed an negative except where noted in HPI.  Gen: Denies any fever, chills, sweats, anorexia, fatigue, weakness, malaise, weight loss, and sleep  disorder CV: Denies chest pain, angina, palpitations, syncope, orthopnea, PND, peripheral edema, and claudication. Resp: Denies dyspnea at rest, dyspnea with exercise, cough, sputum, wheezing, coughing up blood, and pleurisy. GI: Denies vomiting blood, jaundice, and fecal incontinence.   Denies dysphagia or odynophagia. GU : Denies urinary burning, blood in urine, urinary frequency, urinary hesitancy, nocturnal urination, and urinary incontinence. MS: Denies joint pain, limitation of movement, and swelling, stiffness, low back pain, extremity pain. Denies muscle weakness, cramps, atrophy.  Derm: Denies rash, itching, dry skin, hives, moles, warts, or unhealing ulcers.  Psych: Denies depression, anxiety, memory loss, suicidal ideation, hallucinations, paranoia, and confusion. Heme: Denies bruising, bleeding, and enlarged lymph nodes. Neuro:  Denies any headaches, dizziness, paresthesias. Endo:  Denies any problems with DM, thyroid, adrenal function.   Physical Exam: General:  Alert, well-developed, in NAD Head:  Normocephalic and atraumatic. Eyes:  Sclera clear, no icterus.   Conjunctiva pink. Ears:  Normal auditory acuity. Mouth:  No deformity or lesions.  Neck:  Supple; no masses . Lungs:  Clear throughout to auscultation.   No wheezes, crackles, or rhonchi. No acute distress. Heart:  Regular rate and rhythm; no murmurs. Abdomen:  Soft, nondistended, nontender. No masses, hepatomegaly. No obvious masses.  Normal bowel .    Rectal:  Deferred   Msk:  Symmetrical without gross deformities.. Pulses:  Normal pulses noted. Extremities:  Without edema. Neurologic:  Alert and  oriented x4;  grossly normal neurologically. Skin:  Intact without significant lesions or rashes. Cervical Nodes:  No significant cervical adenopathy. Psych:  Alert and cooperative. Normal mood and affect.   Impression / Johnson:   Personal history of adenomatous colon polyps and a family history of colon cancer in a  first-degree relative presenting for colonoscopy.   This patient is appropriate for endoscopic procedures in the ambulatory setting.    Blake Johnson. Blake Johnson  09/24/2021, 7:59 AM See Shea Evans,  GI, to contact our on call provider

## 2021-09-24 NOTE — Progress Notes (Signed)
Pt's states no medical or surgical changes since previsit or office visit. 

## 2021-09-24 NOTE — Progress Notes (Signed)
Called to room to assist during endoscopic procedure.  Patient ID and intended procedure confirmed with present staff. Received instructions for my participation in the procedure from the performing physician.  

## 2021-09-26 ENCOUNTER — Telehealth: Payer: Self-pay | Admitting: *Deleted

## 2021-09-26 NOTE — Telephone Encounter (Signed)
  Follow up Call-  Call back number 09/24/2021  Post procedure Call Back phone  # [phone]  Permission to leave phone message Yes  Some recent data might be hidden     Patient questions:  Message left to call us if necessary.

## 2021-09-26 NOTE — Telephone Encounter (Signed)
  Follow up Call-  Call back number 09/24/2021  Post procedure Call Back phone  # [phone]  Permission to leave phone message Yes  Some recent data might be hidden     Patient questions:  Do you have a fever, pain , or abdominal swelling? No. Pain Score  0 *  Have you tolerated food without any problems? Yes.    Have you been able to return to your normal activities? Yes.    Do you have any questions about your discharge instructions: Diet   No. Medications  No. Follow up visit  No.  Do you have questions or concerns about your Care? No.  Actions: * If pain score is 4 or above: No action needed, pain <4.  Have you developed a fever since your procedure? no  2.   Have you had an respiratory symptoms (SOB or cough) since your procedure? no  3.   Have you tested positive for COVID 19 since your procedure no  4.   Have you had any family members/close contacts diagnosed with the COVID 19 since your procedure?  no   If yes to any of these questions please route to Joylene John, RN and Joella Prince, RN

## 2021-10-07 ENCOUNTER — Encounter: Payer: Self-pay | Admitting: Gastroenterology

## 2021-11-07 ENCOUNTER — Other Ambulatory Visit: Payer: Self-pay | Admitting: Internal Medicine

## 2021-11-07 ENCOUNTER — Encounter: Payer: Self-pay | Admitting: Internal Medicine

## 2021-11-07 MED ORDER — PSEUDOEPHEDRINE HCL ER 120 MG PO TB12: ORAL_TABLET | ORAL | 2 refills | Status: DC

## 2021-11-07 MED ORDER — DEXAMETHASONE 4 MG PO TABS: ORAL_TABLET | ORAL | 0 refills | Status: DC

## 2021-11-07 MED ORDER — AZITHROMYCIN 250 MG PO TABS: ORAL_TABLET | ORAL | 1 refills | Status: DC

## 2021-12-02 ENCOUNTER — Other Ambulatory Visit: Payer: Self-pay | Admitting: Internal Medicine

## 2021-12-02 DIAGNOSIS — K219 Gastro-esophageal reflux disease without esophagitis: Secondary | ICD-10-CM

## 2022-01-16 ENCOUNTER — Encounter: Payer: Self-pay | Admitting: Internal Medicine

## 2022-03-24 ENCOUNTER — Encounter: Payer: Self-pay | Admitting: Internal Medicine

## 2022-03-25 ENCOUNTER — Encounter: Payer: Self-pay | Admitting: Adult Health

## 2022-03-25 ENCOUNTER — Ambulatory Visit (INDEPENDENT_AMBULATORY_CARE_PROVIDER_SITE_OTHER): Payer: Managed Care, Other (non HMO) | Admitting: Adult Health

## 2022-03-25 VITALS — BP 122/84 | HR 70 | Temp 97.5°F | Wt 287.4 lb

## 2022-03-25 DIAGNOSIS — J069 Acute upper respiratory infection, unspecified: Secondary | ICD-10-CM

## 2022-03-25 DIAGNOSIS — Z1152 Encounter for screening for COVID-19: Secondary | ICD-10-CM | POA: Diagnosis not present

## 2022-03-25 LAB — POC COVID19 BINAXNOW: SARS Coronavirus 2 Ag: NEGATIVE

## 2022-03-25 MED ORDER — AZITHROMYCIN 250 MG PO TABS: ORAL_TABLET | ORAL | 0 refills | Status: DC

## 2022-03-25 MED ORDER — PREDNISONE 20 MG PO TABS
ORAL_TABLET | ORAL | 0 refills | Status: DC
Start: 2022-03-25 — End: 2022-04-06

## 2022-03-25 NOTE — Progress Notes (Signed)
Assessment and Plan: ? ?Harley was seen today for acute visit. ? ?Diagnoses and all orders for this visit: ? ?Viral URI with cough ?Hx and exam suggestive of viral URI, currently day 3 ?Dicussed typical course and duration ?Discussed the importance of avoiding unnecessary antibiotic therapy. ?Suggested symptomatic OTC remedies. ?Nasal saline spray for congestion. ?oral steroids offered ?Continue promethazine DM ?Add mucinex if needed, push fluids ?Defer abx unless persistent/progressive sx past 7-10 days ?Follow up as needed. ?-     predniSONE (DELTASONE) 20 MG tablet; 2 tablets daily for 3 days, 1 tablet daily for 4 days. ? ?Encounter for screening for COVID-19 ?-     POC COVID-19 ? ?Other orders ?Hold at pharmacy, only start if persistent/progressive sx over the weekend with increased productivity, dyspnea, fever/chills ?-     azithromycin (ZITHROMAX) 250 MG tablet; Take 2 tablets with Food on  Day 1, then 1 tablet Daily with Food. ? ?Further disposition pending results of labs. Discussed med's effects and SE's.   ?Over 30 minutes of exam, counseling, chart review, and critical decision making was performed.  ? ?Future Appointments  ?Date Time Provider [place]  ?05/26/2022  3:00 PM Unk Pinto, MD [facility] None  ? ? ?------------------------------------------------------------------------------------------------------------------ ? ? ?HPI ?BP 122/84   Pulse 70   Temp (!) 97.5 ?F (36.4 ?C)   Wt 287 lb 6.4 oz (130.4 kg)   SpO2 98%   BMI 35.92 kg/m?  ?61 y.o.male presents for evaluation of URI, "chest cold" that started over the weekend. He tested negative for covid 19 today  ? ?Sx began 4 days ago after he spent the weekend with grandkids, started developing a dry cough and vocal hoarseness on the way. Has noted some mild fatigue. Denies dyspnea. He reports cough has settled deep into his chest, productive of small amount of deep brown secretion  ? ?Humidifier and vicks vapo rub ?He has been  using promethazine- DM cough syrup, works very well  ? ?Denies sore throat, fever/chills ? ? ?Past Medical History:  ?Diagnosis Date  ? Diverticulosis of colon   ? mild  02/06/2011  ? GERD (gastroesophageal reflux disease)   ? Hypertension   ? Varicose veins   ?  ? ?No Known Allergies ? ?Current Outpatient Medications on File Prior to Visit  ?Medication Sig  ? Cholecalciferol (VITAMIN D PO) Take 5,000 Units by mouth 2 (two) times daily.  ? famotidine (PEPCID) 40 MG tablet TAKE 1 TABLET AT BEDTIME FOR ACID REFLUX (Patient taking differently: as needed. Take 1 tablet at bedtime for Acid Reflux)  ? Multiple Vitamin (MULTI-VITAMINS) TABS Take 1 tablet by mouth daily.  ? nadolol (CORGARD) 40 MG tablet Take  1/2 to 1 tablet  every Morning  for BP  ? rosuvastatin (CRESTOR) 10 MG tablet Take 1 tablet Daily for Cholesterol  ? Zinc Chelated 50 MG TABS Take 50 mg by mouth daily.  ? pseudoephedrine (SUDAFED) 120 MG 12 hr tablet Take   1 tablet    2 x /day (every 12 hours)    for Sinus & Chest Congestion (Patient not taking: Reported on 03/25/2022)  ? ?No current facility-administered medications on file prior to visit.  ? ? ?ROS: all negative except above.  ? ?Physical Exam: ? ?BP 122/84   Pulse 70   Temp (!) 97.5 ?F (36.4 ?C)   Wt 287 lb 6.4 oz (130.4 kg)   SpO2 98%   BMI 35.92 kg/m?  ? ?General Appearance: Well nourished, in no apparent distress. ?Eyes:  PERRLA, EOMs, conjunctiva no swelling or erythema ?Sinuses: No Frontal/maxillary tenderness ?ENT/Mouth: Ext aud canals clear, TMs without erythema, bulging. No erythema, swelling, or exudate on post pharynx.  Tonsils not swollen or erythematous. Hearing normal.  ?Neck: Supple, thyroid normal.  ?Respiratory: Respiratory effort normal, BS equal bilaterally without rales, rhonchi, wheezing or stridor.  ?Cardio: RRR with no MRGs. Brisk peripheral pulses without edema.  ?Abdomen: Soft, + BS.  Non tender, no guarding, rebound, hernias, masses. ?Lymphatics: Non tender without  lymphadenopathy.  ?Musculoskeletal: Full ROM, 5/5 strength, normal gait.  ?Skin: Warm, dry without rashes, lesions, ecchymosis.  ?Neuro: Cranial nerves intact. Normal muscle tone, no cerebellar symptoms. Sensation intact.  ?Psych: Awake and oriented X 3, normal affect, Insight and Judgment appropriate.  ?  ? ?Izora Ribas, NP ?6:11 PM ?[facility] ? ?

## 2022-03-25 NOTE — Patient Instructions (Signed)

## 2022-04-06 ENCOUNTER — Ambulatory Visit: Payer: Managed Care, Other (non HMO) | Admitting: Nurse Practitioner

## 2022-04-06 ENCOUNTER — Encounter: Payer: Self-pay | Admitting: Nurse Practitioner

## 2022-04-06 VITALS — BP 163/93 | HR 77 | Temp 100.0°F

## 2022-04-06 DIAGNOSIS — U071 COVID-19: Secondary | ICD-10-CM | POA: Diagnosis not present

## 2022-04-06 MED ORDER — AZITHROMYCIN 250 MG PO TABS: ORAL_TABLET | ORAL | 1 refills | Status: DC

## 2022-04-06 MED ORDER — PROMETHAZINE-DM 6.25-15 MG/5ML PO SYRP: 5.0000 mL | ORAL_SOLUTION | Freq: Four times a day (QID) | ORAL | 1 refills | Status: DC | PRN

## 2022-04-06 MED ORDER — MOLNUPIRAVIR EUA 200MG CAPSULE: 4.0000 | ORAL_CAPSULE | Freq: Two times a day (BID) | ORAL | 0 refills | Status: AC

## 2022-04-06 MED ORDER — DEXAMETHASONE 1 MG PO TABS: ORAL_TABLET | ORAL | 0 refills | Status: DC

## 2022-04-06 NOTE — Progress Notes (Signed)
THIS ENCOUNTER IS A VIRTUAL VISIT DUE TO COVID-19 - PATIENT WAS NOT SEEN IN THE OFFICE.  ?PATIENT HAS CONSENTED TO VIRTUAL VISIT / TELEMEDICINE VISIT ? ? ?Virtual Visit via telephone Note ? ?I connected with  Laqueta Carina on 04/06/2022 by telephone.  ?I verified that I am speaking with the correct person using two identifiers.  ?  ?I discussed the limitations of evaluation and management by telemedicine and the availability of in person appointments. The patient expressed understanding and agreed to proceed. ? ?History of Present Illness: ? ?Pulse 77   Temp 100 ?F (37.8 ?C)   SpO2 93%  ?61 y.o. patient contacted office reporting URI sx . he tested positive by home test today. OV was conducted by telephone to minimize exposure. This patient was vaccinated for covid 19, last 09/17/21 Pfizer bivalent booster ?Immunization History  ?Administered Date(s) Administered  ? Influenza, Quadrivalent, Recombinant, Inj, Pf 10/28/2019  ? Influenza,inj,Quad PF,6+ Mos 10/01/2018, 11/23/2020  ? Influenza-Unspecified 09/26/2016, 10/14/2017  ? PFIZER(Purple Top)SARS-COV-2 Vaccination 02/29/2020, 03/21/2020, 10/26/2020  ? PPD Test 12/31/2015, 01/05/2017, 02/03/2018, 04/12/2019, 05/22/2020, 05/27/2021  ? Tdap 12/31/2015, 09/17/2021  ? ? ?Sx began 3 days ago with cough/congestion of brown tinged mucus, fatigue, headache and mild fever(99-100). Denies body aches, nausea, vomiting, diarrhea, loss of smell/taste ? ?Treatments tried so far: Dayquil minimal relief ? ?Exposures: Recently flew on airplane from [place] ? ? ?Medications ? ?Current Outpatient Medications (Endocrine & Metabolic):  ?  predniSONE (DELTASONE) 20 MG tablet, 2 tablets daily for 3 days, 1 tablet daily for 4 days. (Patient not taking: Reported on 04/06/2022) ? ?Current Outpatient Medications (Cardiovascular):  ?  nadolol (CORGARD) 40 MG tablet, Take  1/2 to 1 tablet  every Morning  for BP ? ?Current Outpatient Medications (Respiratory):  ?  pseudoephedrine (SUDAFED) 120  MG 12 hr tablet, Take   1 tablet    2 x /day (every 12 hours)    for Sinus & Chest Congestion (Patient not taking: Reported on 03/25/2022) ? ?Current Outpatient Medications (Analgesics):  ?  naproxen sodium (ALEVE) 220 MG tablet, Take 220 mg by mouth. ? ? ?Current Outpatient Medications (Other):  ?  Cholecalciferol (VITAMIN D PO), Take 5,000 Units by mouth 2 (two) times daily. ?  famotidine (PEPCID) 40 MG tablet, TAKE 1 TABLET AT BEDTIME FOR ACID REFLUX (Patient taking differently: as needed. Take 1 tablet at bedtime for Acid Reflux) ?  Multiple Vitamin (MULTI-VITAMINS) TABS, Take 1 tablet by mouth daily. ?  Zinc Chelated 50 MG TABS, Take 50 mg by mouth daily. ?  azithromycin (ZITHROMAX) 250 MG tablet, Take 2 tablets with Food on  Day 1, then 1 tablet Daily with Food. (Patient not taking: Reported on 04/06/2022) ? ?Allergies: No Known Allergies ? ?Problem list ?He has GERD (gastroesophageal reflux disease); Elevated BP without diagnosis of hypertension; Prediabetes; Vitamin D deficiency; Fatigue; Medication management; Mixed hyperlipidemia; and Family hx of colon cancer on their problem list. ?  ?Social History:  ? reports that he has never smoked. He has never used smokeless tobacco. He reports current alcohol use of about 2.0 - 3.0 standard drinks per week. He reports that he does not use drugs. ? ?Observations/Objective: ? ?General : Well sounding patient in no apparent distress ?HEENT: no hoarseness, no cough for duration of visit ?Lungs: speaks in complete sentences, no audible wheezing, no apparent distress ?Neurological: alert, oriented x 3 ?Psychiatric: pleasant, judgement appropriate  ? ?Assessment and Plan: ? ?Covid 19 ?-     azithromycin (ZITHROMAX) 250 MG  tablet; Take 2 tablets (500 mg) on  Day 1,  followed by 1 tablet (250 mg) once daily on Days 2 through 5. ?-     molnupiravir EUA (LAGEVRIO) 200 mg CAPS capsule; Take 4 capsules (800 mg total) by mouth 2 (two) times daily for 5 days. ?-     dexamethasone  (DECADRON) 1 MG tablet; Take 3 tabs for 3 days, 2 tabs for 3 days 1 tab for 5 days. Take with food. ?-     promethazine-dextromethorphan (PROMETHAZINE-DM) 6.25-15 MG/5ML syrup; Take 5 mLs by mouth 4 (four) times daily as needed for cough. ? ?  ?Covid 19 positive per rapid screening test in home today ?Risk factors include: None ?Symptoms are: mild ?Due to co morbid conditions and risk factors, discussed antivirals  ?Immue support reviewed  ?Take tylenol PRN temp 101+ ?Push hydration ?Regular ambulation or calf exercises exercises for clot prevention and 81 mg ASA unless contraindicated ?Sx supportive therapy suggested ?Follow up via mychart or telephone if needed ?Advised patient obtain O2 monitor; present to ED if persistently <90% or with severe dyspnea, CP, fever uncontrolled by tylenol, confusion, sudden decline ?Should remain in isolation until at least 5 days from onset of sx, 24-48 hours fever free without tylenol, sx such as cough are improved.  ? ? ? ? ?Follow Up Instructions: ? ?I discussed the assessment and treatment plan with the patient. The patient was provided an opportunity to ask questions and all were answered. The patient agreed with the plan and demonstrated an understanding of the instructions. ?  ?The patient was advised to call back or seek an in-person evaluation if the symptoms worsen or if the condition fails to improve as anticipated. ? ?I provided 20 minutes of non-face-to-face time during this encounter. ? ? ?Magda Bernheim, NP  ?

## 2022-04-06 NOTE — Telephone Encounter (Signed)
Covid positive can you get set up for phone visit

## 2022-05-26 ENCOUNTER — Encounter: Payer: Self-pay | Admitting: Internal Medicine

## 2022-05-26 ENCOUNTER — Ambulatory Visit (INDEPENDENT_AMBULATORY_CARE_PROVIDER_SITE_OTHER): Payer: Managed Care, Other (non HMO) | Admitting: Internal Medicine

## 2022-05-26 VITALS — BP 160/101 | HR 68 | Temp 96.9°F | Resp 16 | Ht 75.0 in | Wt 285.0 lb

## 2022-05-26 DIAGNOSIS — Z131 Encounter for screening for diabetes mellitus: Secondary | ICD-10-CM

## 2022-05-26 DIAGNOSIS — Z1389 Encounter for screening for other disorder: Secondary | ICD-10-CM

## 2022-05-26 DIAGNOSIS — Z0001 Encounter for general adult medical examination with abnormal findings: Secondary | ICD-10-CM

## 2022-05-26 DIAGNOSIS — Z0000 Encounter for general adult medical examination without abnormal findings: Secondary | ICD-10-CM | POA: Diagnosis not present

## 2022-05-26 DIAGNOSIS — Z13220 Encounter for screening for lipoid disorders: Secondary | ICD-10-CM | POA: Diagnosis not present

## 2022-05-26 DIAGNOSIS — I10 Essential (primary) hypertension: Secondary | ICD-10-CM

## 2022-05-26 DIAGNOSIS — Z125 Encounter for screening for malignant neoplasm of prostate: Secondary | ICD-10-CM

## 2022-05-26 DIAGNOSIS — R0989 Other specified symptoms and signs involving the circulatory and respiratory systems: Secondary | ICD-10-CM

## 2022-05-26 DIAGNOSIS — Z136 Encounter for screening for cardiovascular disorders: Secondary | ICD-10-CM | POA: Diagnosis not present

## 2022-05-26 DIAGNOSIS — I700 Atherosclerosis of aorta: Secondary | ICD-10-CM | POA: Diagnosis not present

## 2022-05-26 DIAGNOSIS — J3081 Allergic rhinitis due to animal (cat) (dog) hair and dander: Secondary | ICD-10-CM

## 2022-05-26 DIAGNOSIS — N401 Enlarged prostate with lower urinary tract symptoms: Secondary | ICD-10-CM

## 2022-05-26 DIAGNOSIS — E559 Vitamin D deficiency, unspecified: Secondary | ICD-10-CM | POA: Diagnosis not present

## 2022-05-26 DIAGNOSIS — K219 Gastro-esophageal reflux disease without esophagitis: Secondary | ICD-10-CM

## 2022-05-26 DIAGNOSIS — Z130 Encounter for screening for diseases of the blood and blood-forming organs and certain disorders involving the immune mechanism: Secondary | ICD-10-CM

## 2022-05-26 DIAGNOSIS — Z111 Encounter for screening for respiratory tuberculosis: Secondary | ICD-10-CM

## 2022-05-26 DIAGNOSIS — E349 Endocrine disorder, unspecified: Secondary | ICD-10-CM

## 2022-05-26 DIAGNOSIS — Z79899 Other long term (current) drug therapy: Secondary | ICD-10-CM | POA: Diagnosis not present

## 2022-05-26 DIAGNOSIS — E782 Mixed hyperlipidemia: Secondary | ICD-10-CM

## 2022-05-26 DIAGNOSIS — R350 Frequency of micturition: Secondary | ICD-10-CM | POA: Diagnosis not present

## 2022-05-26 DIAGNOSIS — Z1329 Encounter for screening for other suspected endocrine disorder: Secondary | ICD-10-CM

## 2022-05-26 DIAGNOSIS — Z6835 Body mass index (BMI) 35.0-35.9, adult: Secondary | ICD-10-CM

## 2022-05-26 DIAGNOSIS — R7303 Prediabetes: Secondary | ICD-10-CM

## 2022-05-26 DIAGNOSIS — R7309 Other abnormal glucose: Secondary | ICD-10-CM

## 2022-05-26 DIAGNOSIS — R5383 Other fatigue: Secondary | ICD-10-CM

## 2022-05-26 MED ORDER — PANTOPRAZOLE SODIUM 40 MG PO TBEC: DELAYED_RELEASE_TABLET | ORAL | 3 refills | Status: DC

## 2022-05-26 MED ORDER — OLMESARTAN MEDOXOMIL 40 MG PO TABS: ORAL_TABLET | ORAL | 3 refills | Status: DC

## 2022-05-26 MED ORDER — CETIRIZINE HCL 10 MG PO TABS: ORAL_TABLET | ORAL | Status: DC

## 2022-05-26 MED ORDER — BISOPROLOL-HYDROCHLOROTHIAZIDE 10-6.25 MG PO TABS: ORAL_TABLET | ORAL | 3 refills | Status: DC

## 2022-05-26 NOTE — Patient Instructions (Addendum)
Due to recent changes in healthcare laws, you may see the results of your imaging and laboratory studies on MyChart before your provider has had a chance to review them.  We understand that in some cases there may be results that are confusing or concerning to you. Not all laboratory results come back in the same time frame and the provider may be waiting for multiple results in order to interpret others.  Please give [place] 48 hours in order for your provider to thoroughly review all the results before contacting the office for clarification of your results.   +++++++++++++++++++++++++++++++  Vit D  & Vit C 1,000 mg   are recommended to help protect  against the Covid-19 and other Corona viruses.    Also it's recommended  to take  Zinc 50 mg  to help  protect against the Covid-19   and best place to get  is also on [email or web address]  and don't pay more than 6-8 cents /pill !  ================================  Gastroesophageal Reflux Disease, Adult Gastroesophageal reflux (GER) happens when acid from the stomach flows up into the tube that connects the mouth and the stomach (esophagus). Normally, food travels down the esophagus and stays in the stomach to be digested. However, when a person has GER, food and stomach acid sometimes move back up into the esophagus. If this becomes a more serious problem, the person may be diagnosed with a disease called gastroesophageal reflux disease (GERD). GERD occurs when the reflux: Happens often. Causes frequent or severe symptoms. Causes problems such as damage to the esophagus. When stomach acid comes in contact with the esophagus, the acid may cause inflammation in the esophagus. Over time, GERD may create small holes (ulcers) in the lining of the esophagus. What are the causes? This condition is caused by a problem with the muscle between the esophagus and the stomach (lower esophageal sphincter, or LES). Normally, the LES muscle closes after food passes  through the esophagus to the stomach. When the LES is weakened or abnormal, it does not close properly, and that allows food and stomach acid to go back up into the esophagus. The LES can be weakened by certain dietary substances, medicines, and medical conditions, including: Tobacco use. Pregnancy. Having a hiatal hernia. Alcohol use. Certain foods and beverages, such as coffee, chocolate, onions, and peppermint. What increases the risk? You are more likely to develop this condition if you: Have an increased body weight. Have a connective tissue disorder. Take NSAIDs, such as ibuprofen. What are the signs or symptoms? Symptoms of this condition include: Heartburn. Difficult or painful swallowing and the feeling of having a lump in the throat. A bitter taste in the mouth. Bad breath and having a large amount of saliva. Having an upset or bloated stomach and belching. Chest pain. Different conditions can cause chest pain. Make sure you see your health care provider if you experience chest pain. Shortness of breath or wheezing. Ongoing (chronic) cough or a nighttime cough. Wearing away of tooth enamel. Weight loss. How is this diagnosed? This condition may be diagnosed based on a medical history and a physical exam. To determine if you have mild or severe GERD, your health care provider may also monitor how you respond to treatment. You may also have tests, including: A test to examine your stomach and esophagus with a small camera (endoscopy). A test that measures the acidity level in your esophagus. A test that measures how much pressure is on your esophagus. A barium swallow  or modified barium swallow test to show the shape, size, and functioning of your esophagus. How is this treated? Treatment for this condition may vary depending on how severe your symptoms are. Your health care provider may recommend: Changes to your diet. Medicine. Surgery. The goal of treatment is to help  relieve your symptoms and to prevent complications. Follow these instructions at home: Eating and drinking  Follow a diet as recommended by your health care provider. This may involve avoiding foods and drinks such as: Coffee and tea, with or without caffeine. Drinks that contain alcohol. Energy drinks and sports drinks. Carbonated drinks or sodas. Chocolate and cocoa. Peppermint and mint flavorings. Garlic and onions. Horseradish. Spicy and acidic foods, including peppers, chili powder, curry powder, vinegar, hot sauces, and barbecue sauce. Citrus fruit juices and citrus fruits, such as oranges, lemons, and limes. Tomato-based foods, such as red sauce, chili, salsa, and pizza with red sauce. Fried and fatty foods, such as donuts, french fries, potato chips, and high-fat dressings. High-fat meats, such as hot dogs and fatty cuts of red and white meats, such as rib eye steak, sausage, ham, and bacon. High-fat dairy items, such as whole milk, butter, and cream cheese. Eat small, frequent meals instead of large meals. Avoid drinking large amounts of liquid with your meals. Avoid eating meals during the 2-3 hours before bedtime. Avoid lying down right after you eat. Do not exercise right after you eat. Lifestyle  Do not use any products that contain nicotine or tobacco. These products include cigarettes, chewing tobacco, and vaping devices, such as e-cigarettes. If you need help quitting, ask your health care provider. Try to reduce your stress by using methods such as yoga or meditation. If you need help reducing stress, ask your health care provider. If you are overweight, reduce your weight to an amount that is healthy for you. Ask your health care provider for guidance about a safe weight loss goal. General instructions Pay attention to any changes in your symptoms. Take over-the-counter and prescription medicines only as told by your health care provider. Do not take aspirin,  ibuprofen, or other NSAIDs unless your health care provider told you to take these medicines. Wear loose-fitting clothing. Do not wear anything tight around your waist that causes pressure on your abdomen. Raise (elevate) the head of your bed about 6 inches (15 cm). You can use a wedge to do this. Avoid bending over if this makes your symptoms worse. Keep all follow-up visits. This is important. Contact a health care provider if: You have: New symptoms. Unexplained weight loss. Difficulty swallowing or it hurts to swallow. Wheezing or a persistent cough. A hoarse voice. Your symptoms do not improve with treatment. Get help right away if: You have sudden pain in your arms, neck, jaw, teeth, or back. You suddenly feel sweaty, dizzy, or light-headed. You have chest pain or shortness of breath. You vomit and the vomit is green, yellow, or black, or it looks like blood or coffee grounds. You faint. You have stool that is red, bloody, or black. You cannot swallow, drink, or eat. These symptoms may represent a serious problem that is an emergency. Do not wait to see if the symptoms will go away. Get medical help right away. Call your local emergency services (911 in the [place] Do not drive yourself to the hospital. Summary Gastroesophageal reflux happens when acid from the stomach flows up into the esophagus. GERD is a disease in which the reflux happens often, causes frequent  or severe symptoms, or causes problems such as damage to the esophagus. Treatment for this condition may vary depending on how severe your symptoms are. Your health care provider may recommend diet and lifestyle changes, medicine, or surgery. Contact a health care provider if you have new or worsening symptoms. Take over-the-counter and prescription medicines only as told by your health care provider. Do not take aspirin, ibuprofen, or other NSAIDs unless your health care provider told you to do so. Keep all follow-up  visits as told by your health care provider. This is important. This information is not intended to replace advice given to you by your health care provider. Make sure you discuss any questions you have with your health care provider. Document Revised: 06/10/2020 Document Reviewed: 06/10/2020 Elsevier Patient Education  [place] for Gastroesophageal Reflux Disease, Adult When you have gastroesophageal reflux disease (GERD), the foods you eat and your eating habits are very important. Choosing the right foods can help ease the discomfort of GERD. Consider working with a dietitian to help you make healthy food choices. What are tips for following this plan? Reading food labels Look for foods that are low in saturated fat. Foods that have less than 5% of daily value (DV) of fat and 0 g of trans fats may help with your symptoms. Cooking Cook foods using methods other than frying. This may include baking, steaming, grilling, or broiling. These are all methods that do not need a lot of fat for cooking. To add flavor, try to use herbs that are low in spice and acidity. Meal planning  Choose healthy foods that are low in fat, such as fruits, vegetables, whole grains, low-fat dairy products, lean meats, fish, and poultry. Eat frequent, small meals instead of three large meals each day. Eat your meals slowly, in a relaxed setting. Avoid bending over or lying down until 2-3 hours after eating. Limit high-fat foods such as fatty meats or fried foods. Limit your intake of fatty foods, such as oils, butter, and shortening. Avoid the following as told by your health care provider: Foods that cause symptoms. These may be different for different people. Keep a food diary to keep track of foods that cause symptoms. Alcohol. Drinking large amounts of liquid with meals. Eating meals during the 2-3 hours before bed. Lifestyle Maintain a healthy weight. Ask your health care provider what  weight is healthy for you. If you need to lose weight, work with your health care provider to do so safely. Exercise for at least 30 minutes on 5 or more days each week, or as told by your health care provider. Avoid wearing clothes that fit tightly around your waist and chest. Do not use any products that contain nicotine or tobacco. These products include cigarettes, chewing tobacco, and vaping devices, such as e-cigarettes. If you need help quitting, ask your health care provider. Sleep with the head of your bed raised. Use a wedge under the mattress or blocks under the bed frame to raise the head of the bed. Chew sugar-free gum after mealtimes. What foods should I eat?  Eat a healthy, well-balanced diet of fruits, vegetables, whole grains, low-fat dairy products, lean meats, fish, and poultry. Each person is different. Foods that may trigger symptoms in one person may not trigger any symptoms in another person. Work with your health care provider to identify foods that are safe for you. The items listed above may not be a complete list of recommended foods and  beverages. Contact a dietitian for more information. What foods should I avoid? Limiting some of these foods may help manage the symptoms of GERD. Everyone is different. Consult a dietitian or your health care provider to help you identify the exact foods to avoid, if any. Fruits Any fruits prepared with added fat. Any fruits that cause symptoms. For some people this may include citrus fruits, such as oranges, grapefruit, pineapple, and lemons. Vegetables Deep-fried vegetables. [place] fries. Any vegetables prepared with added fat. Any vegetables that cause symptoms. For some people, this may include tomatoes and tomato products, chili peppers, onions and garlic, and horseradish. Grains Pastries or quick breads with added fat. Meats and other proteins High-fat meats, such as fatty beef or pork, hot dogs, ribs, ham, sausage, salami, and  bacon. Fried meat or protein, including fried fish and fried chicken. Nuts and nut butters, in large amounts. Dairy Whole milk and chocolate milk. Sour cream. Cream. Ice cream. Cream cheese. Milkshakes. Fats and oils Butter. Margarine. Shortening. Ghee. Beverages Coffee and tea, with or without caffeine. Carbonated beverages. Sodas. Energy drinks. Fruit juice made with acidic fruits, such as orange or grapefruit. Tomato juice. Alcoholic drinks. Sweets and desserts Chocolate and cocoa. Donuts. Seasonings and condiments Pepper. Peppermint and spearmint. Added salt. Any condiments, herbs, or seasonings that cause symptoms. For some people, this may include curry, hot sauce, or vinegar-based salad dressings. The items listed above may not be a complete list of foods and beverages to avoid. Contact a dietitian for more information. Questions to ask your health care provider Diet and lifestyle changes are usually the first steps that are taken to manage symptoms of GERD. If diet and lifestyle changes do not improve your symptoms, talk with your health care provider about taking medicines. Where to find more information International [employer] for Gastrointestinal Disorders: aboutgerd.org Summary When you have gastroesophageal reflux disease (GERD), food and lifestyle choices may be very helpful in easing the discomfort of GERD. Eat frequent, small meals instead of three large meals each day. Eat your meals slowly, in a relaxed setting. Avoid bending over or lying down until 2-3 hours after eating. Limit high-fat foods such as fatty meats or fried foods. This information is not intended to replace advice given to you by your health care provider. Make sure you discuss any questions you have with your health care provider. Document Revised: 06/10/2020 Document Reviewed: 06/10/2020 Elsevier Patient Education  [place] (COVID-19) Are you at risk?  Are you at risk for the  Coronavirus (COVID-19)?  To be considered HIGH RISK for Coronavirus (COVID-19), you have to meet the following criteria:  Traveled to [place], [place], [place], [place] or [place]; or in the [place] to [place], [place], [place]  or [place]; and have fever, cough, and shortness of breath within the last 2 weeks of travel OR Been in close contact with a person diagnosed with COVID-19 within the last 2 weeks and have  fever, cough,and shortness of breath  IF YOU DO NOT MEET THESE CRITERIA, YOU ARE CONSIDERED LOW RISK FOR COVID-19.  What to do if you are HIGH RISK for COVID-19?  If you are having a medical emergency, call 911. Seek medical care right away. Before you go to a doctor's office, urgent care or emergency department,  call ahead and tell them about your recent travel, contact with someone diagnosed with COVID-19   and your symptoms.  You should receive instructions from your physician's office regarding next  steps of care.  When you arrive at healthcare provider, tell the healthcare staff immediately you have returned from  visiting [place], [place], [place], [place] or [place]; or traveled in the [place] to [place], [place],  [place] or [place] in the last two weeks or you have been in close contact with a person diagnosed with  COVID-19 in the last 2 weeks.   Tell the health care staff about your symptoms: fever, cough and shortness of breath. After you have been seen by a medical provider, you will be either: Tested for (COVID-19) and discharged home on quarantine except to seek medical care if  symptoms worsen, and asked to  Stay home and avoid contact with others until you get your results (4-5 days)  Avoid travel on public transportation if possible (such as bus, train, or airplane) or Sent to the Emergency Department by EMS for evaluation, COVID-19 testing  and  possible admission depending on your condition and test results.  What to do if you are  LOW RISK for COVID-19?  Reduce your risk of any infection by using the same precautions used for avoiding the common cold or flu:  Wash your hands often with soap and warm water for at least 20 seconds.  If soap and water are not readily available,  use an alcohol-based hand sanitizer with at least 60% alcohol.  If coughing or sneezing, cover your mouth and nose by coughing or sneezing into the elbow areas of your shirt or coat,  into a tissue or into your sleeve (not your hands). Avoid shaking hands with others and consider head nods or verbal greetings only. Avoid touching your eyes, nose, or mouth with unwashed hands.  Avoid close contact with people who are sick. Avoid places or events with large numbers of people in one location, like concerts or sporting events. Carefully consider travel plans you have or are making. If you are planning any travel outside or inside the [place], visit the CDC's Travelers' Health webpage for the latest health notices. If you have some symptoms but not all symptoms, continue to monitor at home and seek medical attention  if your symptoms worsen. If you are having a medical emergency, call 911. >>>>>>>>>>>>>>>>>>>>>>>>>>>>>>>>>>>>>>>>>>>>>>>>>>> We Do NOT Approve of [employer] > > > > > > > > > > > > > > > > > > > > > > > > > > > > > > > > > > >  > >    Preventive Care for Adults  A healthy lifestyle and preventive care can promote health and wellness. Preventive health guidelines for men include the following key practices: A routine yearly physical is a good way to check with your health care provider about your health and preventative screening. It is a chance to share any concerns and updates on your health and to receive a thorough exam. Visit your dentist for a routine exam and preventative care every 6 months. Brush your teeth twice a day and floss once a day. Good oral hygiene prevents tooth decay and gum disease. The frequency of eye exams is  based on your age, health, family medical history, use of contact lenses, and other factors. Follow your health care provider's recommendations for frequency of eye exams. Eat a healthy diet. Foods such as vegetables, fruits, whole grains, low-fat dairy products, and lean protein foods contain the nutrients you need without too many calories. Decrease your intake of foods high in solid fats,  added sugars, and salt. Eat the right amount of calories for you. Get information about a proper diet from your health care provider, if necessary. Regular physical exercise is one of the most important things you can do for your health. Most adults should get at least 150 minutes of moderate-intensity exercise (any activity that increases your heart rate and causes you to sweat) each week. In addition, most adults need muscle-strengthening exercises on 2 or more days a week. Maintain a healthy weight. The body mass index (BMI) is a screening tool to identify possible weight problems. It provides an estimate of body fat based on height and weight. Your health care provider can find your BMI and can help you achieve or maintain a healthy weight. For adults 20 years and older: A BMI below 18.5 is considered underweight. A BMI of 18.5 to 24.9 is normal. A BMI of 25 to 29.9 is considered overweight. A BMI of 30 and above is considered obese. Maintain normal blood lipids and cholesterol levels by exercising and minimizing your intake of saturated fat. Eat a balanced diet with plenty of fruit and vegetables. Blood tests for lipids and cholesterol should begin at age 75 and be repeated every 5 years. If your lipid or cholesterol levels are high, you are over 50, or you are at high risk for heart disease, you may need your cholesterol levels checked more frequently. Ongoing high lipid and cholesterol levels should be treated with medicines if diet and exercise are not working. If you smoke, find out from your health care  provider how to quit. If you do not use tobacco, do not start. Lung cancer screening is recommended for adults aged 81-80 years who are at high risk for developing lung cancer because of a history of smoking. A yearly low-dose CT scan of the lungs is recommended for people who have at least a 30-pack-year history of smoking and are a current smoker or have quit within the past 15 years. A pack year of smoking is smoking an average of 1 pack of cigarettes a day for 1 year (for example: 1 pack a day for 30 years or 2 packs a day for 15 years). Yearly screening should continue until the smoker has stopped smoking for at least 15 years. Yearly screening should be stopped for people who develop a health problem that would prevent them from having lung cancer treatment. If you choose to drink alcohol, do not have more than 2 drinks per day. One drink is considered to be 12 ounces (355 mL) of beer, 5 ounces (148 mL) of wine, or 1.5 ounces (44 mL) of liquor. Avoid use of street drugs. Do not share needles with anyone. Ask for help if you need support or instructions about stopping the use of drugs. High blood pressure causes heart disease and increases the risk of stroke. Your blood pressure should be checked at least every 1-2 years. Ongoing high blood pressure should be treated with medicines, if weight loss and exercise are not effective. If you are 80-53 years old, ask your health care provider if you should take aspirin to prevent heart disease. Diabetes screening involves taking a blood sample to check your fasting blood sugar level. Testing should be considered at a younger age or be carried out more frequently if you are overweight and have at least 1 risk factor for diabetes. Colorectal cancer can be detected and often prevented. Most routine colorectal cancer screening begins at the age of 86 and continues  through age 43. However, your health care provider may recommend screening at an earlier age if you  have risk factors for colon cancer. On a yearly basis, your health care provider may provide home test kits to check for hidden blood in the stool. Use of a small camera at the end of a tube to directly examine the colon (sigmoidoscopy or colonoscopy) can detect the earliest forms of colorectal cancer. Talk to your health care provider about this at age 12, when routine screening begins. Direct exam of the colon should be repeated every 5-10 years through age 36, unless early forms of precancerous polyps or small growths are found. Hepatitis C blood testing is recommended for all people born from [birth year] through 1966 and any individual with known risks for hepatitis C. Screening for abdominal aortic aneurysm (AAA)  by ultrasound is recommended for people who have history of high blood pressure or who are current or former smokers. Healthy men should  receive prostate-specific antigen (PSA) blood tests as part of routine cancer screening. Talk with your health care provider about prostate cancer screening. Testicular cancer screening is  recommended for adult males. Screening includes self-exam, a health care provider exam, and other screening tests. Consult with your health care provider about any symptoms you have or any concerns you have about testicular cancer. Use sunscreen. Apply sunscreen liberally and repeatedly throughout the day. You should seek shade when your shadow is shorter than you. Protect yourself by wearing long sleeves, pants, a wide-brimmed hat, and sunglasses year round, whenever you are outdoors. Once a month, do a whole-body skin exam, using a mirror to look at the skin on your back. Tell your health care provider about new moles, moles that have irregular borders, moles that are larger than a pencil eraser, or moles that have changed in shape or color. Stay current with required vaccines (immunizations). Influenza vaccine. All adults should be immunized every year. Tetanus, diphtheria,  and acellular pertussis (Td, Tdap) vaccine. An adult who has not previously received Tdap or who does not know his vaccine status should receive 1 dose of Tdap. This initial dose should be followed by tetanus and diphtheria toxoids (Td) booster doses every 10 years. Adults with an unknown or incomplete history of completing a 3-dose immunization series with Td-containing vaccines should begin or complete a primary immunization series including a Tdap dose. Adults should receive a Td booster every 10 years. Zoster vaccine. One dose is recommended for adults aged 25 years or older unless certain conditions are present.  PREVNAR - Pneumococcal 13-valent conjugate (PCV13) vaccine. When indicated, a person who is uncertain of his immunization history and has no record of immunization should receive the PCV13 vaccine. An adult aged 27 years or older who has certain medical conditions and has not been previously immunized should receive 1 dose of PCV13 vaccine. This PCV13 should be followed with a dose of pneumococcal polysaccharide (PPSV23) vaccine. The PPSV23 vaccine dose should be obtained 1 or more year(s)after the dose of PCV13 vaccine. An adult aged 57 years or older who has certain medical conditions and previously received 1 or more doses of PPSV23 vaccine should receive 1 dose of PCV13. The PCV13 vaccine dose should be obtained 1 or more years after the last PPSV23 vaccine dose.  PNEUMOVAX - Pneumococcal polysaccharide (PPSV23) vaccine. When PCV13 is also indicated, PCV13 should be obtained first. All adults aged 33 years and older should be immunized. An adult younger than age 37 years who has certain  medical conditions should be immunized. Any person who resides in a nursing home or long-term care facility should be immunized. An adult smoker should be immunized. People with an immunocompromised condition and certain other conditions should receive both PCV13 and PPSV23 vaccines. People with human  immunodeficiency virus (HIV) infection should be immunized as soon as possible after diagnosis. Immunization during chemotherapy or radiation therapy should be avoided. Routine use of PPSV23 vaccine is not recommended for American Indians, [place] Natives, or people younger than 65 years unless there are medical conditions that require PPSV23 vaccine. When indicated, people who have unknown immunization and have no record of immunization should receive PPSV23 vaccine. One-time revaccination 5 years after the first dose of PPSV23 is recommended for people aged 19-64 years who have chronic kidney failure, nephrotic syndrome, asplenia, or immunocompromised conditions. People who received 1-2 doses of PPSV23 before age 62 years should receive another dose of PPSV23 vaccine at age 46 years or later if at least 5 years have passed since the previous dose. Doses of PPSV23 are not needed for people immunized with PPSV23 at or after age 29 years.  Hepatitis A vaccine. Adults who wish to be protected from this disease, have certain high-risk conditions, work with hepatitis A-infected animals, work in hepatitis A research labs, or travel to or work in countries with a high rate of hepatitis A should be immunized. Adults who were previously unvaccinated and who anticipate close contact with an international adoptee during the first 60 days after arrival in the [place] from a country with a high rate of hepatitis A should be immunized.  Hepatitis B vaccine. Adults should be immunized if they wish to be protected from this disease, have certain high-risk conditions, may be exposed to blood or other infectious body fluids, are household contacts or sex partners of hepatitis B positive people, are clients or workers in certain care facilities, or travel to or work in countries with a high rate of hepatitis B.  Preventive Service / Frequency  Ages 71 and over Blood pressure check. Lipid and cholesterol check. Lung  cancer screening. / Every year if you are aged 46-80 years and have a 30-pack-year history of smoking and currently smoke or have quit within the past 15 years. Yearly screening is stopped once you have quit smoking for at least 15 years or develop a health problem that would prevent you from having lung cancer treatment. Fecal occult blood test (FOBT) of stool. You may not have to do this test if you get a colonoscopy every 10 years. Flexible sigmoidoscopy** or colonoscopy.** / Every 5 years for a flexible sigmoidoscopy or every 10 years for a colonoscopy beginning at age 37 and continuing until age 91. Hepatitis C blood test.** / For all people born from [birth year] through 1966 and any individual with known risks for hepatitis C. Abdominal aortic aneurysm (AAA) screening./ Screening current or former smokers or have Hypertension. Skin self-exam. / Monthly. Influenza vaccine. / Every year. Tetanus, diphtheria, and acellular pertussis (Tdap/Td) vaccine.** / 1 dose of Td every 10 years.  Zoster vaccine.** / 1 dose for adults aged 52 years or older.         Pneumococcal 13-valent conjugate (PCV13) vaccine.   Pneumococcal polysaccharide (PPSV23) vaccine.   Hepatitis A vaccine.** / Consult your health care provider. Hepatitis B vaccine.** / Consult your health care provider. Screening for abdominal aortic aneurysm (AAA)  by ultrasound is recommended for people who have history of high blood pressure or who  are current or former smokers. ++++++++++ Recommend Adult Low Dose Aspirin or  coated  Aspirin 81 mg daily  To reduce risk of Colon Cancer 40 %,  Skin Cancer 26 % ,  Malignant Melanoma 46%  and  Pancreatic cancer 60% ++++++++++++++++++++++ Vitamin D goal  is between 70-100.  Please make sure that you are taking your Vitamin D as directed.  It is very important as a natural anti-inflammatory  helping hair, skin, and nails, as well as reducing stroke and heart attack risk.  It helps your bones  and helps with mood. It also decreases numerous cancer risks so please take it as directed.  Low Vit D is associated with a 200-300% higher risk for CANCER  and 200-300% higher risk for HEART   ATTACK  &  STROKE.   .....................................Marland Kitchen It is also associated with higher death rate at younger ages,  autoimmune diseases like Rheumatoid arthritis, Lupus, Multiple Sclerosis.    Also many other serious conditions, like depression, Alzheimer's Dementia, infertility, muscle aches, fatigue, fibromyalgia - just to name a few. ++++++++++++++++++++++ Recommend the book "The END of DIETING" by Dr Excell Seltzer  & the book "The END of DIABETES " by Dr Excell Seltzer At [email or web address] - get book & Audio CD's    Being diabetic has a  300% increased risk for heart attack, stroke, cancer, and alzheimer- type vascular dementia. It is very important that you work harder with diet by avoiding all foods that are white. Avoid white rice (brown & wild rice is OK), white potatoes (sweetpotatoes in moderation is OK), White bread or wheat bread or anything made out of white flour like bagels, donuts, rolls, buns, biscuits, cakes, pastries, cookies, pizza crust, and pasta (made from white flour & egg whites) - vegetarian pasta or spinach or wheat pasta is OK. Multigrain breads like Arnold's or Pepperidge [employer], or multigrain sandwich thins or flatbreads.  Diet, exercise and weight loss can reverse and cure diabetes in the early stages.  Diet, exercise and weight loss is very important in the control and prevention of complications of diabetes which affects every system in your body, ie. Brain - dementia/stroke, eyes - glaucoma/blindness, heart - heart attack/heart failure, kidneys - dialysis, stomach - gastric paralysis, intestines - malabsorption, nerves - severe painful neuritis, circulation - gangrene & loss of a leg(s), and finally cancer and Alzheimers.    I recommend avoid fried & greasy foods,  sweets/candy,  white rice (brown or wild rice or Quinoa is OK), white potatoes (sweet potatoes are OK) - anything made from white flour - bagels, doughnuts, rolls, buns, biscuits,white and wheat breads, pizza crust and traditional pasta made of white flour & egg white(vegetarian pasta or spinach or wheat pasta is OK).  Multi-grain bread is OK - like multi-grain flat bread or sandwich thins. Avoid alcohol in excess. Exercise is also important.    Eat all the vegetables you want - avoid meat, especially red meat and dairy - especially cheese.  Cheese is the most concentrated form of trans-fats which is the worst thing to clog up our arteries. Veggie cheese is OK which can be found in the fresh produce section at [employer] or [employer] or [employer]  ++++++++++++++++++++++ DASH Eating Plan  DASH stands for "Dietary Approaches to Stop Hypertension."   The DASH eating plan is a healthy eating plan that has been shown to reduce high blood pressure (hypertension). Additional health benefits may include reducing the risk of type 2 diabetes mellitus, heart disease,  and stroke. The DASH eating plan may also help with weight loss. WHAT DO I NEED TO KNOW ABOUT THE DASH EATING PLAN? For the DASH eating plan, you will follow these general guidelines: Choose foods with a percent daily value for sodium of less than 5% (as listed on the food label). Use salt-free seasonings or herbs instead of table salt or sea salt. Check with your health care provider or pharmacist before using salt substitutes. Eat lower-sodium products, often labeled as "lower sodium" or "no salt added." Eat fresh foods. Eat more vegetables, fruits, and low-fat dairy products. Choose whole grains. Look for the word "whole" as the first word in the ingredient list. Choose fish  Limit sweets, desserts, sugars, and sugary drinks. Choose heart-healthy fats. Eat veggie cheese  Eat more home-cooked food and less restaurant, buffet, and fast food. Limit  fried foods. Cook foods using methods other than frying. Limit canned vegetables. If you do use them, rinse them well to decrease the sodium. When eating at a restaurant, ask that your food be prepared with less salt, or no salt if possible.                      WHAT FOODS CAN I EAT? Read Dr Fara Olden Fuhrman's books on The End of Dieting & The End of Diabetes  Grains Whole grain or whole wheat bread. Brown rice. Whole grain or whole wheat pasta. Quinoa, bulgur, and whole grain cereals. Low-sodium cereals. Corn or whole wheat flour tortillas. Whole grain cornbread. Whole grain crackers. Low-sodium crackers.  Vegetables Fresh or frozen vegetables (raw, steamed, roasted, or grilled). Low-sodium or reduced-sodium tomato and vegetable juices. Low-sodium or reduced-sodium tomato sauce and paste. Low-sodium or reduced-sodium canned vegetables.   Fruits All fresh, canned (in natural juice), or frozen fruits.  Protein Products  All fish and seafood.  Dried beans, peas, or lentils. Unsalted nuts and seeds. Unsalted canned beans.  Dairy Low-fat dairy products, such as skim or 1% milk, 2% or reduced-fat cheeses, low-fat ricotta or cottage cheese, or plain low-fat yogurt. Low-sodium or reduced-sodium cheeses.  Fats and Oils Tub margarines without trans fats. Light or reduced-fat mayonnaise and salad dressings (reduced sodium). Avocado. Safflower, olive, or canola oils. Natural peanut or almond butter.  Other Unsalted popcorn and pretzels. The items listed above may not be a complete list of recommended foods or beverages. Contact your dietitian for more options.  ++++++++++++++++++++  WHAT FOODS ARE NOT RECOMMENDED? Grains/ White flour or wheat flour White bread. White pasta. White rice. Refined cornbread. Bagels and croissants. Crackers that contain trans fat.  Vegetables  Creamed or fried vegetables. Vegetables in a . Regular canned vegetables. Regular canned tomato sauce and paste. Regular  tomato and vegetable juices.  Fruits Dried fruits. Canned fruit in light or heavy syrup. Fruit juice.  Meat and Other Protein Products Meat in general - RED meat & White meat.  Fatty cuts of meat. Ribs, chicken wings, all processed meats as bacon, sausage, bologna, salami, fatback, hot dogs, bratwurst and packaged luncheon meats.  Dairy Whole or 2% milk, cream, half-and-half, and cream cheese. Whole-fat or sweetened yogurt. Full-fat cheeses or blue cheese. Non-dairy creamers and whipped toppings. Processed cheese, cheese spreads, or cheese curds.  Condiments Onion and garlic salt, seasoned salt, table salt, and sea salt. Canned and packaged gravies. Worcestershire sauce. Tartar sauce. Barbecue sauce. Teriyaki sauce. Soy sauce, including reduced sodium. Steak sauce. Fish sauce. Oyster sauce. Cocktail sauce. Horseradish. Ketchup and mustard. Meat flavorings and  tenderizers. Bouillon cubes. Hot sauce. Tabasco sauce. Marinades. Taco seasonings. Relishes.  Fats and Oils Butter, stick margarine, lard, shortening and bacon fat. Coconut, palm kernel, or palm oils. Regular salad dressings.  Pickles and olives. Salted popcorn and pretzels.  The items listed above may not be a complete list of foods and beverages to avoid.

## 2022-05-26 NOTE — Progress Notes (Signed)
Annual  Screening/Preventative Visit  & Comprehensive Evaluation & Examination  Future Appointments  Date Time Provider Department  05/26/2022  3:00 PM Unk Pinto, MD [facility]  05/31/2023  3:00 PM Unk Pinto, MD [facility]               This very nice 61 y.o. MWM presents for a Screening /Preventative Visit & comprehensive evaluation and management of multiple medical co-morbidities.  Patient has been followed for labile HTN, HLD, Prediabetes and Vitamin D Deficiency.  Patient has hx/o GERD & has been having recent evening & nighttime break thru of Indigestion  & reflux despite his Famotidine  20 mg & OTC Omeprazole 20 mg & Tums.        Labile HTN predates circa Jan 2017. Patient's BP has been controlled at home.  Today's BP is high normal 160/101.  Patient reports episodic sweating facial flushing and multiple random elevated BP's ranging from 140-180 / 88-98.  In 2017, patient had a negative Stress Cardiolite scan. Patient denies any cardiac symptoms as chest pain, palpitations, shortness of breath, dizziness or ankle swelling.       Patient's hyperlipidemia is not controlled with diet.  Last lipids were not at goal:  Lab Results  Component Value Date   CHOL 217 (H) 09/17/2021   HDL 44 09/17/2021   LDLCALC 135 (H) 09/17/2021   TRIG 235 (H) 09/17/2021   CHOLHDL 4.9 09/17/2021        Patient has Moderate Obesity (BMI 35.62 ) and consequent  prediabetes (A1c 5.8% /2017) and patient denies reactive hypoglycemic symptoms, visual blurring, diabetic polys or paresthesias. Last A1c was normal  & at goal:   Lab Results  Component Value Date   HGBA1C 5.3 09/17/2021                                 Patient has hx/o Low Testosterone ("171" /2017 - "122" /2018 and "173" /2019) and has  deferred replacement therapy.        Finally, patient has history of Vitamin D Deficiency ("26" /2017) and last vitamin D was not at at goal (70-100):   Lab Results  Component Value Date    VD25OH 46 09/17/2021      Current Outpatient Medications on File Prior to Visit  Medication Sig   aspirin EC 81 MG Take  daily.   VITAMIN D 5,000 Units Take   2 (two) times daily.   famotidine 40 MG  TAKE 1 TABLET AT BEDTIME    Multiple Vitamin  Take 1 tablet daily.   MultVit-Minerals Take   Zinc Chelated 50 MG  Take daily.    No Known Allergies   Past Medical History:  Diagnosis Date   Diverticulosis of colon    mild  02/06/2011   GERD (gastroesophageal reflux disease)    Varicose veins     Health Maintenance  Topic Date Due   Pneumococcal Vaccine 17-32 Years old (1 - PCV) Never done   Zoster Vaccines- Shingrix (1 of 2) Never done   COVID-19 Vaccine (4 - Booster for Pfizer series) 01/26/2021   INFLUENZA VACCINE  07/14/2021   COLONOSCOPY  08/21/2021   TETANUS/TDAP  12/30/2025   Hepatitis C Screening  Completed   HIV Screening  Completed   HPV VACCINES  Aged Out    Immunization History  Administered Date(s) Administered   Influenza, Quadrivalent, Recombinant, Inj, Pf 10/28/2019   Influenza,inj,Quad PF,6+ Mos  10/01/2018, 11/23/2020   Influenza-Unspecified 09/26/2016, 10/14/2017   PFIZER(Purple Top)SARS-COV-2 Vaccination 02/29/2020, 03/21/2020, 10/26/2020   PPD Test 12/31/2015, 01/05/2017, 02/03/2018, 04/12/2019, 05/22/2020   Tdap 12/31/2015    Last Colon - 09/24/2021 - Dr Fuller Plan - Recommended 77yr  f/u - 09/24/2024   Past Surgical History:  Procedure Laterality Date   BASAL CELL CARCINOMA EXCISION     removed from back,shoulder blade, and lower back   CHOLECYSTECTOMY     PILONIDAL CYST EXCISION     VARICOSE VEIN SURGERY     right leg     Family History  Problem Relation Age of Onset   Colon cancer Father    Cancer Father    Alzheimer's disease Mother    Colon cancer Paternal Aunt    Colon cancer Paternal Grandmother    Coronary artery disease Neg Hx     Social History   Socioeconomic History   Marital status: Married    Spouse name: CMalachy Mood   Number of children: 2 sons & 2 daughters  Occupational History   Engineer  Tobacco Use   Smoking status: Never   Smokeless tobacco: Never   Tobacco comments:    10 yrs ago  Substance and Sexual Activity   Alcohol use: Yes    Alcohol/week: 2.0 - 3.0 standard drinks    Types: 2 - 3 Glasses of wine per week    Comment: 2-3 drinks weekly   Drug use: No   Sexual activity: Not on file     ROS Constitutional: Denies fever, chills, weight loss/gain, headaches, insomnia,  night sweats or change in appetite. Does c/o fatigue. Eyes: Denies redness, blurred vision, diplopia, discharge, itchy or watery eyes.  ENT: Denies discharge, congestion, post nasal drip, epistaxis, sore throat, earache, hearing loss, dental pain, Tinnitus, Vertigo, Sinus pain or snoring.  Cardio: Denies chest pain, palpitations, irregular heartbeat, syncope, dyspnea, diaphoresis, orthopnea, PND, claudication or edema Respiratory: denies cough, dyspnea, DOE, pleurisy, hoarseness, laryngitis or wheezing.  Gastrointestinal: Denies dysphagia, heartburn, reflux, water brash, pain, cramps, nausea, vomiting, bloating, diarrhea, constipation, hematemesis, melena, hematochezia, jaundice or hemorrhoids Genitourinary: Denies dysuria, frequency, urgency, nocturia, hesitancy, discharge, hematuria or flank pain Musculoskeletal: Denies arthralgia, myalgia, stiffness, Jt. Swelling, pain, limp or strain/sprain. Denies Falls. Skin: Denies puritis, rash, hives, warts, acne, eczema or change in skin lesion Neuro: No weakness, tremor, incoordination, spasms, paresthesia or pain Psychiatric: Denies confusion, memory loss or sensory loss. Denies Depression. Endocrine: Denies change in weight, skin, hair change, nocturia, and paresthesia, diabetic polys, visual blurring or hyper / hypo glycemic episodes.  Heme/Lymph: No excessive bleeding, bruising or enlarged lymph nodes.   Physical Exam  BP (!) 160/101   Pulse 68   Temp (!) 96.9 F (36.1 C)    Resp 16   Ht '6\' 3"'$  (1.905 m)   Wt 285 lb (129.3 kg)   SpO2 99%   BMI 35.62 kg/m   General Appearance: Well nourished and well groomed and in no apparent distress.  Eyes: PERRLA, EOMs, conjunctiva no swelling or erythema, normal fundi and vessels. Sinuses: No frontal/maxillary tenderness ENT/Mouth: EACs patent / TMs  nl. Nares clear without erythema, swelling, mucoid exudates. Oral hygiene is good. No erythema, swelling, or exudate. Tongue normal, non-obstructing. Tonsils not swollen or erythematous. Hearing normal.  Neck: Supple, thyroid not palpable. No bruits, nodes or JVD. Respiratory: Respiratory effort normal.  BS equal and clear bilateral without rales, rhonci, wheezing or stridor. Cardio: Heart sounds are normal with regular rate and rhythm and no murmurs, rubs  or gallops. Peripheral pulses are normal and equal bilaterally without edema. No aortic or femoral bruits. Chest: symmetric with normal excursions and percussion.  Abdomen: Soft, with Nl bowel sounds. Nontender, no guarding, rebound, hernias, masses, or organomegaly.  Lymphatics: Non tender without lymphadenopathy.  Musculoskeletal: Full ROM all peripheral extremities, joint stability, 5/5 strength, and normal gait & arm swing. Skin: Warm and dry without rashes, lesions, cyanosis, clubbing or  ecchymosis.  Neuro: Cranial nerves intact, reflexes equal bilaterally. Normal muscle tone, no cog wheeling, no cerebellar symptoms. Sensation intact.  Fine high frequency low amplitude of thumbs at rest , L>R.  Pysch: Alert and oriented X 3 with normal affect, insight and judgment appropriate.   Assessment and Plan  1. Annual Preventative/Screening Exam    2. Essential hypertension  - EKG 12-Lead - [place], RETROPERITNL ABD,  LTD - Urinalysis, Routine w reflex microscopic - Microalbumin / creatinine urine ratio - CBC with Differential/Platelet - COMPLETE METABOLIC PANEL WITH GFR - Magnesium - TSH  - D/C   Nadolol   -  bisoprolol-hydrochlorothiazide (ZIAC) 10-6.25 MG tablet;  Take  1 tablet  every Night  for BP   Dispense: 90 tablet; Refill: 3   - olmesartan (BENICAR) 40 MG tablet;  Take  1/2 to 1 tablet  every Morning  for BP   Dispense: 90 tablet; Refill: 3  3. Hyperlipidemia, mixed  - EKG 12-Lead - [place], RETROPERITNL ABD,  LTD - Lipid panel - TSH  4. Abnormal glucose  - EKG 12-Lead - [place], RETROPERITNL ABD,  LTD - Hemoglobin A1c - Insulin, random  5. Vitamin D deficiency  - VITAMIN D 25 Hydroxy   6. Gastroesophageal reflux disease without esophagitis  - D/C Omeprazole & Famotidine.   -  New Rx pantoprazole (PROTONIX) 40 MG tablet;  Take  1 tablet  Daily  with Supper  for Indigestion & Acid Reflux   Dispense: 90 tablet; Refill: 3  7. Class 2 severe obesity due to excess calories with serious comorbidity  and   body   mass   index   (BMI)  of   35.0   to   35.9  in  adult   (HCC)  - TSH - Hemoglobin A1c  8. Testosterone deficiency  - Testosterone  9. Prediabetes   10. Allergy to cats  - cetirizine (ZYRTEC ALLERGY) 10 MG tablet;  Take 1 tablet  at Bedtime  for Allergies  11. Prostate cancer screening  - PSA  12. Screening-pulmonary TB  - TB Skin Test  13. Screening for heart disease  - EKG 12-Lead  14. Screening for AAA (aortic abdominal aneurysm)  - [place], RETROPERITNL ABD,  LTD  15. Fatigue, unspecified type  - Iron, Total/Total Iron Binding Cap - Vitamin B12 - CBC with Differential/Platelet - TSH  16. Medication management  - Urinalysis, Routine w reflex microscopic - Microalbumin / creatinine urine ratio - CBC with Differential/Platelet - COMPLETE METABOLIC PANEL WITH GFR - Magnesium - Lipid panel - TSH - Hemoglobin A1c - Insulin, random - VITAMIN D 25 Hydroxy        Patient was counseled in prudent diet, weight control to achieve/maintain BMI less than 25, BP monitoring, regular exercise and medications as discussed.  Discussed med effects and  SE's. Routine screening labs and tests as requested with regular follow-up as recommended.        Offered for patient to see a movement disorder Neurologist , but he declined for now.  Also with c/o snoring & Daytime excessive  sleepiness , offered referral for sleep study which he also declined for now.   Over 40 minutes of exam, counseling, chart review and high complex critical decision making was performed   Kirtland Bouchard, MD

## 2022-05-27 ENCOUNTER — Encounter: Payer: Self-pay | Admitting: Internal Medicine

## 2022-05-27 LAB — COMPLETE METABOLIC PANEL WITH GFR
AG Ratio: 1.5 (calc) (ref 1.0–2.5)
ALT: 27 U/L (ref 9–46)
AST: 17 U/L (ref 10–35)
Albumin: 4.2 g/dL (ref 3.6–5.1)
Alkaline phosphatase (APISO): 92 U/L (ref 35–144)
BUN: 15 mg/dL (ref 7–25)
CO2: 28 mmol/L (ref 20–32)
Calcium: 9.1 mg/dL (ref 8.6–10.3)
Chloride: 103 mmol/L (ref 98–110)
Creat: 0.93 mg/dL (ref 0.70–1.35)
Globulin: 2.8 g/dL (calc) (ref 1.9–3.7)
Glucose, Bld: 110 mg/dL — ABNORMAL HIGH (ref 65–99)
Potassium: 4.6 mmol/L (ref 3.5–5.3)
Sodium: 141 mmol/L (ref 135–146)
Total Bilirubin: 0.3 mg/dL (ref 0.2–1.2)
Total Protein: 7 g/dL (ref 6.1–8.1)
eGFR: 93 mL/min/{1.73_m2} (ref >=60)

## 2022-05-27 LAB — IRON, TOTAL/TOTAL IRON BINDING CAP
%SAT: 13 % (calc) — ABNORMAL LOW (ref 20–48)
Iron: 41 ug/dL — ABNORMAL LOW (ref 50–180)
TIBC: 305 mcg/dL (calc) (ref 250–425)

## 2022-05-27 LAB — CBC WITH DIFFERENTIAL/PLATELET
Absolute Monocytes: 747 cells/uL (ref 200–950)
Basophils Absolute: 58 cells/uL (ref 0–200)
Basophils Relative: 0.6 %
Eosinophils Absolute: 223 cells/uL (ref 15–500)
Eosinophils Relative: 2.3 %
HCT: 41.3 % (ref 38.5–50.0)
Hemoglobin: 13.5 g/dL (ref 13.2–17.1)
Lymphs Abs: 2503 cells/uL (ref 850–3900)
MCH: 29.5 pg (ref 27.0–33.0)
MCHC: 32.7 g/dL (ref 32.0–36.0)
MCV: 90.2 fL (ref 80.0–100.0)
MPV: 11.2 fL (ref 7.5–12.5)
Monocytes Relative: 7.7 %
Neutro Abs: 6169 cells/uL (ref 1500–7800)
Neutrophils Relative %: 63.6 %
Platelets: 288 10*3/uL (ref 140–400)
RBC: 4.58 10*6/uL (ref 4.20–5.80)
RDW: 13.1 % (ref 11.0–15.0)
Total Lymphocyte: 25.8 %
WBC: 9.7 10*3/uL (ref 3.8–10.8)

## 2022-05-27 LAB — URINALYSIS, ROUTINE W REFLEX MICROSCOPIC
Bilirubin Urine: NEGATIVE
Glucose, UA: NEGATIVE
Hgb urine dipstick: NEGATIVE
Ketones, ur: NEGATIVE
Leukocytes,Ua: NEGATIVE
Nitrite: NEGATIVE
Protein, ur: NEGATIVE
Specific Gravity, Urine: 1.02 (ref 1.001–1.035)
pH: <=5 (ref 5.0–8.0)

## 2022-05-27 LAB — LIPID PANEL
Cholesterol: 157 mg/dL (ref <200)
HDL: 42 mg/dL (ref >=40)
LDL Cholesterol (Calc): 82 mg/dL (calc)
Non-HDL Cholesterol (Calc): 115 mg/dL (calc) (ref <130)
Total CHOL/HDL Ratio: 3.7 (calc) (ref <5.0)
Triglycerides: 239 mg/dL — ABNORMAL HIGH (ref <150)

## 2022-05-27 LAB — MICROALBUMIN / CREATININE URINE RATIO
Creatinine, Urine: 126 mg/dL (ref 20–320)
Microalb Creat Ratio: 2 mcg/mg creat (ref <30)
Microalb, Ur: 0.3 mg/dL

## 2022-05-27 LAB — PSA: PSA: 1.18 ng/mL (ref <=4.00)

## 2022-05-27 LAB — MAGNESIUM: Magnesium: 2.3 mg/dL (ref 1.5–2.5)

## 2022-05-27 LAB — VITAMIN D 25 HYDROXY (VIT D DEFICIENCY, FRACTURES): Vit D, 25-Hydroxy: 32 ng/mL (ref 30–100)

## 2022-05-27 LAB — INSULIN, RANDOM: Insulin: 46.8 u[IU]/mL — ABNORMAL HIGH

## 2022-05-27 LAB — TSH: TSH: 2.31 mIU/L (ref 0.40–4.50)

## 2022-05-27 LAB — HEMOGLOBIN A1C
Hgb A1c MFr Bld: 5.5 % of total Hgb (ref <5.7)
Mean Plasma Glucose: 111 mg/dL
eAG (mmol/L): 6.2 mmol/L

## 2022-05-27 LAB — TESTOSTERONE: Testosterone: 103 ng/dL — ABNORMAL LOW (ref 250–827)

## 2022-05-27 LAB — VITAMIN B12: Vitamin B-12: 511 pg/mL (ref 200–1100)

## 2022-05-27 NOTE — Progress Notes (Signed)
<><><><><><><><><><><><><><><><><><><><><><><><><><><><><><><><><> <><><><><><><><><><><><><><><><><><><><><><><><><><><><><><><><><> - Test results slightly outside the reference range are not unusual. If there is anything important, I will review this with you,  otherwise it is considered normal test values.  If you have further questions,  please do not hesitate to contact me at the office or via My Chart.  <><><><><><><><><><><><><><><><><><><><><><><><><><><><><><><><><> <><><><><><><><><><><><><><><><><><><><><><><><><><><><><><><><><>  -  Testosterone level is still low                             - Continue Zinc tablets  &                                                            Losing weight &                                                                           Daily exercise will help raise blood levels raise  <><><><><><><><><><><><><><><><><><><><><><><><><><><><><><><><><>  - Iron level is low - Recommend take an OTC iron supplement Daily  &    - Eat more Veggies with Iron as Carrots, Beets , all leafy green veggies as   Spinach, Collards,  Turnip - Mustard or Mixed Greens, Kale, Asparagus,   Broccoli, Brussel Sprouts, Green Beans / peas, Soybeans, Lentils, Sweet Potatoes <><><><><><><><><><><><><><><><><><><><><><><><><><><><><><><><><>  - Total Chol = 157   & LDL Chol = 82  - Both  Great   - Very low risk for Heart Attack  / Stroke <><><><><><><><><><><><><><><><><><><><><><><><><><><><><><><><><>  - But Triglycerides (   239   ) or fats in blood are too high  (goal is less than 150)    - Recommend avoid fried & greasy foods,  sweets / candy,   - Avoid white rice  (brown or wild rice or Quinoa is OK),   - Avoid white potatoes  (sweet potatoes are OK)   - Avoid anything made from white flour  - bagels, doughnuts, rolls, buns, biscuits, white and   wheat breads, pizza crust and traditional  pasta made of white flour & egg white  - (vegetarian pasta  or spinach or wheat pasta is OK).    - Multi-grain bread is OK - like multi-grain flat bread or  sandwich thins.   - Avoid alcohol in excess.   - Exercise is also important. <><><><><><><><><><><><><><><><><><><><><><><><><><><><><><><><><>  - Insulin level is elevated = 46.8   (  Normal is less than 20 !  )  & shows                                  insulin resistance - a sign of early diabetes and associated with   a 300 % greater risk for heart attacks,  strokes,                                                              cancer &                                                                       Alzheimer type vascular dementia   - All this can be cured  and prevented with losing weight   - get Dr Fara Olden Fuhrman's book 'the End of Diabetes" and "the End of Dieting"  and   add many years of good health to your life. Insulin level is elevated = 46.8   <><><><><><><><><><><><><><><><><><><><><><><><><><><><><><><><><>  - PSA - Normal & OK  <><><><><><><><><><><><><><><><><><><><><><><><><><><><><><><><><>  - Vitamin B12  - Normal & OK  <><><><><><><><><><><><><><><><><><><><><><><><><><><><><><><><><>  - A1c - Normal - No Diabetes - Yet !  <><><><><><><><><><><><><><><><><><><><><><><><><><><><><><><><><>  - Vitamin D = 32 - Extremely Low   - Please let me know if you are taking your recommended 10,000 units /day, So                                                                               I 'll Know how to adjust your dose    - Vitamin D goal is between 70-100.   - Please make sure that you are taking your Vitamin D as directed.   - It is very important as a natural anti-inflammatory and helping the  immune system protect against viral infections, like the Covid-19    helping hair, skin, and nails, as well as reducing stroke and  heart attack risk.   - It helps your bones and helps with mood.  - It also  decreases numerous cancer risks so please  take it as directed.   - Low Vit D is associated with a 200-300% higher risk for  CANCER   and 200-300% higher risk for HEART   ATTACK  &  STROKE.    - It is also associated with higher death rate at younger ages,   autoimmune diseases like Rheumatoid arthritis, Lupus,  Multiple Sclerosis.     - Also many other serious conditions, like depression, Alzheimer's  Dementia, infertility, muscle aches, fatigue, fibromyalgia   - just to name a few. <><><><><><><><><><><><><><><><><><><><><><><><><><><><><><><><><>  - All Else - CBC - Kidneys - Electrolytes - Liver - Magnesium & Thyroid    - all  Normal / OK <><><><><><><><><><><><><><><><><><><><><><><><><><><><><><><><><> <><><><><><><><><><><><><><><><><><><><><><><><><><><><><><><><><>

## 2022-10-01 ENCOUNTER — Other Ambulatory Visit: Payer: Self-pay | Admitting: Internal Medicine

## 2022-10-01 ENCOUNTER — Encounter: Payer: Self-pay | Admitting: Internal Medicine

## 2022-10-01 MED ORDER — DEXAMETHASONE 4 MG PO TABS: ORAL_TABLET | ORAL | 0 refills | Status: DC

## 2022-10-13 ENCOUNTER — Other Ambulatory Visit: Payer: Self-pay | Admitting: Internal Medicine

## 2022-10-22 ENCOUNTER — Other Ambulatory Visit: Payer: Self-pay | Admitting: Nurse Practitioner

## 2022-10-22 ENCOUNTER — Encounter: Payer: Self-pay | Admitting: Nurse Practitioner

## 2022-10-22 MED ORDER — ROSUVASTATIN CALCIUM 10 MG PO TABS: 10.0000 mg | ORAL_TABLET | Freq: Every day | ORAL | 0 refills | Status: DC

## 2022-11-16 ENCOUNTER — Encounter: Payer: Self-pay | Admitting: Internal Medicine

## 2022-11-17 ENCOUNTER — Encounter: Payer: Self-pay | Admitting: Nurse Practitioner

## 2022-11-17 ENCOUNTER — Ambulatory Visit (INDEPENDENT_AMBULATORY_CARE_PROVIDER_SITE_OTHER): Payer: Managed Care, Other (non HMO) | Admitting: Internal Medicine

## 2022-11-17 ENCOUNTER — Encounter: Payer: Self-pay | Admitting: Internal Medicine

## 2022-11-17 VITALS — BP 150/90 | HR 107 | Temp 98.0°F | Resp 16 | Ht 75.0 in | Wt 285.0 lb

## 2022-11-17 DIAGNOSIS — I10 Essential (primary) hypertension: Secondary | ICD-10-CM | POA: Diagnosis not present

## 2022-11-17 DIAGNOSIS — M109 Gout, unspecified: Secondary | ICD-10-CM | POA: Diagnosis not present

## 2022-11-17 DIAGNOSIS — Z79899 Other long term (current) drug therapy: Secondary | ICD-10-CM

## 2022-11-17 DIAGNOSIS — M79672 Pain in left foot: Secondary | ICD-10-CM

## 2022-11-17 MED ORDER — DEXAMETHASONE 4 MG PO TABS: ORAL_TABLET | ORAL | 0 refills | Status: DC

## 2022-11-17 NOTE — Patient Instructions (Signed)
Gout  Gout is a condition that causes painful swelling of the joints. Gout is a type of inflammation of the joints (arthritis). This condition is caused by having too much uric acid in the body. Uric acid is a chemical that forms when the body breaks down substances called purines. Purines are important for building body proteins. When the body has too much uric acid, Morren crystals can form and build up inside the joints. This causes pain and swelling. Gout attacks can happen quickly and may be very painful (acute gout). Over time, the attacks can affect more joints and become more frequent (chronic gout). Gout can also cause uric acid to build up under the skin and inside the kidneys. What are the causes? This condition is caused by too much uric acid in your blood. This can happen because: Your kidneys do not remove enough uric acid from your blood. This is the most common cause. Your body makes too much uric acid. This can happen with some cancers and cancer treatments. It can also occur if your body is breaking down too many red blood cells (hemolytic anemia). You eat too many foods that are high in purines. These foods include organ meats and some seafood. Alcohol, especially beer, is also high in purines. A gout attack may be triggered by trauma or stress. What increases the risk? The following factors may make you more likely to develop this condition: Having a family history of gout. Being male and middle-aged. Being male and having gone through menopause. Taking certain medicines, including aspirin, cyclosporine, diuretics, levodopa, and niacin. Having an organ transplant. Having certain conditions, such as: Being obese. Lead poisoning. Kidney disease. A skin condition called psoriasis. Other factors include: Losing weight too quickly. Being dehydrated. Frequently drinking alcohol, especially beer. Frequently drinking beverages that are sweetened with a type of sugar called  fructose. What are the signs or symptoms? An attack of acute gout happens quickly. It usually occurs in just one joint. The most common place is the big toe. Attacks often start at night. Other joints that may be affected include joints of the feet, ankle, knee, fingers, wrist, or elbow. Symptoms of this condition may include: Severe pain. Warmth. Swelling. Stiffness. Tenderness. The affected joint may be very painful to touch. Shiny, red, or purple skin. Chills and fever. Chronic gout may cause symptoms more frequently. More joints may be involved. You may also have white or yellow lumps (tophi) on your hands or feet or in other areas near your joints. How is this diagnosed? This condition is diagnosed based on your symptoms, your medical history, and a physical exam. You may have tests, such as: Blood tests to measure uric acid levels. Removal of joint fluid with a thin needle (aspiration) to look for uric acid crystals. X-rays to look for joint damage. How is this treated? Treatment for this condition has two phases: treating an acute attack and preventing future attacks. Acute gout treatment may include medicines to reduce pain and swelling, including: NSAIDs, such as ibuprofen. Steroids. These are strong anti-inflammatory medicines that can be taken by mouth (orally) or injected into a joint. Colchicine. This medicine relieves pain and swelling when it is taken soon after an attack. It can be given by mouth or through an IV. Preventive treatment may include: Daily use of smaller doses of NSAIDs or colchicine. Use of a medicine that reduces uric acid levels in your blood, such as allopurinol. Changes to your diet. You may need to see  a dietitian about what to eat and drink to prevent gout. Follow these instructions at home: During a gout attack  If directed, put ice on the affected area. To do this: Put ice in a plastic bag. Place a towel between your skin and the bag. Leave the  ice on for 20 minutes, 2-3 times a day. Remove the ice if your skin turns bright red. This is very important. If you cannot feel pain, heat, or cold, you have a greater risk of damage to the area. Raise (elevate) the affected joint above the level of your heart as often as possible. Rest the joint as much as possible. If the affected joint is in your leg, you may be given crutches to use. Follow instructions from your health care provider about eating or drinking restrictions. Avoiding future gout attacks Follow a low-purine diet as told by your dietitian or health care provider. Avoid foods and drinks that are high in purines, including liver, kidney, anchovies, asparagus, herring, mushrooms, mussels, and beer. Maintain a healthy weight or lose weight if you are overweight. If you want to lose weight, talk with your health care provider. Do not lose weight too quickly. Start or maintain an exercise program as told by your health care provider. Eating and drinking Avoid drinking beverages that contain fructose. Drink enough fluids to keep your urine pale yellow. If you drink alcohol: Limit how much you have to: 0-1 drink a day for women who are not pregnant. 0-2 drinks a day for men. Know how much alcohol is in a drink. In the [place] one drink equals one 12 oz bottle of beer (355 mL), one 5 oz glass of wine (148 mL), or one 1 oz glass of hard liquor (44 mL). General instructions Take over-the-counter and prescription medicines only as told by your health care provider. Ask your health care provider if the medicine prescribed to you requires you to avoid driving or using machinery. Return to your normal activities as told by your health care provider. Ask your health care provider what activities are safe for you. Keep all follow-up visits. This is important. Where to find more information [employer] [facility]: www.niams.[employer].es Contact a health care provider if you have: Another  gout attack. Continuing symptoms of a gout attack after 10 days of treatment. Side effects from your medicines. Chills or a fever. Burning pain when you urinate. Pain in your lower back or abdomen. Get help right away if you: Have severe or uncontrolled pain. Cannot urinate. Summary Gout is painful swelling of the joints caused by having too much uric acid in the body. The most common site for gout to occur is in the big toe, but it can affect other joints in the body. Medicines and dietary changes can help to prevent and treat gout attacks. ============================================== Low-Purine Eating Plan A low-purine eating plan involves making food choices to limit your purine intake. Purine is a kind of uric acid. Too much uric acid in your blood can cause certain conditions, such as gout and kidney stones. Eating a low-purine diet may help control these conditions. What are tips for following this plan? Shopping Avoid buying products that contain high-fructose corn syrup. Check for this on food labels. It is commonly found in many processed foods and soft drinks. Be sure to check for it in baked goods such as cookies, canned fruits, and cereals and cereal bars. Avoid buying veal, chicken breast with skin, lamb, and organ meats such as liver.  These types of meats tend to have the highest purine content. Choose dairy products. These may lower uric acid levels. Avoid certain types of fish. Not all fish and seafood have high purine content. Examples with high purine content include anchovies, trout, tuna, sardines, and salmon. Avoid buying beverages that contain alcohol, particularly beer and hard liquor. Alcohol can affect the way your body gets rid of uric acid. Meal planning Learn which foods do or do not affect you. If you find out that a food tends to cause your gout symptoms to flare up, avoid eating that food. You can enjoy foods that do not cause problems. If you have any  questions about a food item, talk with your dietitian or health care provider. Reduce the overall amount of meat in your diet. When you do eat meat, choose ones with lower purine content. Include plenty of fruits and vegetables. Although some vegetables may have a high purine content--such as asparagus, mushrooms, spinach, or cauliflower--it has been shown that these do not contribute to uric acid blood levels as much. Consume at least 1 dairy serving a day. This has been shown to decrease uric acid levels. General information If you drink alcohol: Limit how much you have to: 0-1 drink a day for women who are not pregnant. 0-2 drinks a day for men. Know how much alcohol is in a drink. In the [place] one drink equals one 12 oz bottle of beer (355 mL), one 5 oz glass of wine (148 mL), or one 1 oz glass of hard liquor (44 mL). Drink plenty of water. Try to drink enough to keep your urine pale yellow. Fluids can help remove uric acid from your body. Work with your health care provider and dietitian to develop a plan to achieve or maintain a healthy weight. Losing weight may help reduce uric acid in your blood. What foods are recommended? The following are some types of foods that are good choices when limiting purine intake: Fresh or frozen fruits and vegetables. Whole grains, breads, cereals, and pasta. Rice. Beans, peas, legumes. Nuts and seeds. Dairy products. Fats and oils. The items listed above may not be a complete list. Talk with a dietitian about what dietary choices are best for you. What foods are not recommended? Limit your intake of foods high in purines, including: Beer and other alcohol. Meat-based gravy or sauce. Canned or fresh fish, such as: Anchovies, sardines, herring, salmon, and tuna. Mussels and scallops. Codfish, trout, and haddock. Bacon, veal, chicken breast with skin, and lamb. Organ meats, such as: Liver or kidney. Tripe. Sweetbreads (thymus gland or  pancreas). Wild Clinical biochemist. Yeast or yeast extract supplements. Drinks sweetened with high-fructose corn syrup, such as soda. Processed foods made with high-fructose corn syrup. The items listed above may not be a complete list of foods and beverages you should limit. Contact a dietitian for more information. Summary Eating a low-purine diet may help control conditions caused by too much uric acid in the body, such as gout or kidney stones. Choose low-purine foods, limit alcohol, and limit high-fructose corn syrup. You will learn over time which foods do or do not affect you. If you find out that a food tends to cause your gout symptoms to flare up, avoid eating that food.

## 2022-11-17 NOTE — Progress Notes (Signed)
Future Appointments  Date Time Provider [place]  12/02/2022  9:30 AM Darrol Jump, NP [facility] None  05/31/2023  3:00 PM Unk Pinto, MD [facility] None    History of Present Illness:       This very nice 61 y.o. MWM with  labile HTN, HLD, Prediabetes, GERD and Vitamin D Deficiency who  presents with a  2-3 day hx/o a  painful  swollen erythematous Rt foot . Denies injury or prior hx/gout/. No fever , chills or rash.     Medications                                                                                                                                            cetirizine (ZYRTEC ALLERGY) 10 MG tablet, Take 1 tablet  at Bedtime  for Allergies   naproxen sodium  220 MG tablet, Take 220 mg by mouth.   Cholecalciferol (VITAMIN D PO), Take 5,000 Units h 2 times daily.   famotidine (PEPCID) 40 MG tablet, TAKE 1 TABLET AT BEDTIME as needed   Multiple Vitamin (MULTI-VITAMINS) TABS, Take 1 tablet by mouth daily.   pantoprazole (PROTONIX) 40 MG tablet, Take  1 tablet  Daily  with Supper  for Indigestion & Acid Reflux   Zinc Chelated 50 MG TABS, Take 50 mg by mouth daily.  Problem list He has GERD (gastroesophageal reflux disease); Elevated BP without diagnosis of hypertension; Prediabetes; Vitamin D deficiency; Fatigue; Medication management; Mixed hyperlipidemia; and Family hx of colon cancer on their problem list.   Observations/Objective:  BP (!) 150/90   Pulse (!) 107   Temp 98 F (36.7 C)   Resp 16   Ht '6\' 3"'$  (1.905 m)   Wt 285 lb (129.3 kg)   SpO2 98%   BMI 35.62 kg/m   HEENT - WNL. Neck - supple.  Chest - Clear equal BS. Cor - Nl HS. RRR w/o sig MGR. PP 1(+). No edema. MS- FROM w/o deformities.    Lt foot appears swollen w/o pitting or rash and is erythematous.  Tender to slight touch or pressure. Limping favoring to avoid weight bearing on the Lt foot.  Neuro -  Nl w/o focal abnormalities.  Assessment and Plan:   1. Left foot  pain  - dexamethasone  4 MG tablet;  Take 1 tab 3 x day - 3 days, then 2 x day - 3 days, then 1 tab daily   Dispense: 20 tablet  - Also recommende thak Acer[oooooooooooooooooooooooooooooooooooooooooooooooooooooooooooooooooooooooo  - Sedimentation rate - Uric acid - CBC with Differential/Platelet  2. Acute gout of left foot  - dexamethasone  4 MG tablet;  Take 1 tab 3 x day - 3 days, then 2 x day - 3 days, then 1 tab daily Dispense: 20 tablet;  - Sedimentation rate - Uric acid - CBC with Differential/Platelet  3. Essential hypertension  4. Medication management  - Uric acid - CBC with Differential/Platelet    Follow Up Instructions:        I discussed the assessment and treatment plan with the patient. The patient was provided an opportunity to ask questions and all were answered. The patient agreed with the plan and demonstrated an understanding of the instructions.       The patient was advised to call back or seek an in-person evaluation if the symptoms worsen or if the condition fails to improve as anticipated.    Kirtland Bouchard, MD

## 2022-11-18 ENCOUNTER — Other Ambulatory Visit: Payer: Self-pay | Admitting: Internal Medicine

## 2022-11-18 DIAGNOSIS — M79672 Pain in left foot: Secondary | ICD-10-CM

## 2022-11-18 LAB — CBC WITH DIFFERENTIAL/PLATELET
Absolute Monocytes: 780 cells/uL (ref 200–950)
Basophils Absolute: 45 cells/uL (ref 0–200)
Basophils Relative: 0.4 %
Eosinophils Absolute: 124 cells/uL (ref 15–500)
Eosinophils Relative: 1.1 %
HCT: 38.6 % (ref 38.5–50.0)
Hemoglobin: 12.8 g/dL — ABNORMAL LOW (ref 13.2–17.1)
Lymphs Abs: 2034 cells/uL (ref 850–3900)
MCH: 28.8 pg (ref 27.0–33.0)
MCHC: 33.2 g/dL (ref 32.0–36.0)
MCV: 86.9 fL (ref 80.0–100.0)
MPV: 11.4 fL (ref 7.5–12.5)
Monocytes Relative: 6.9 %
Neutro Abs: 8317 cells/uL — ABNORMAL HIGH (ref 1500–7800)
Neutrophils Relative %: 73.6 %
Platelets: 292 10*3/uL (ref 140–400)
RBC: 4.44 10*6/uL (ref 4.20–5.80)
RDW: 13 % (ref 11.0–15.0)
Total Lymphocyte: 18 %
WBC: 11.3 10*3/uL — ABNORMAL HIGH (ref 3.8–10.8)

## 2022-11-18 LAB — URIC ACID: Uric Acid, Serum: 6.8 mg/dL (ref 4.0–8.0)

## 2022-11-18 LAB — SEDIMENTATION RATE: Sed Rate: 96 mm/h — ABNORMAL HIGH (ref 0–20)

## 2022-11-18 MED ORDER — CEPHALEXIN 500 MG PO CAPS: ORAL_CAPSULE | ORAL | 0 refills | Status: DC

## 2022-11-18 NOTE — Progress Notes (Signed)
<><><><><><><><><><><><><><><><><><><><><><><><><><><><><><><><><> <><><><><><><><><><><><><><><><><><><><><><><><><><><><><><><><><>  If you have further questions,                             please do not hesitate to contact me at the office or via My Chart.  <><><><><><><><><><><><><><><><><><><><><><><><><><><><><><><><><> <><><><><><><><><><><><><><><><><><><><><><><><><><><><><><><><><>  -  Uric acid is normal, but doesn't rule out gout & will need to be repeated later. <><><><><><><><><><><><><><><><><><><><><><><><><><><><><><><><><>  -  CBC shows WBC is slightly elevated with elevated Neutrophils    - So possibility  of infection in skin  (or bone)   - As discussed sent in Rx for gKeflex to [facility]  at [address]                                                                             Til we establish whether infection or not <><><><><><><><><><><><><><><><><><><><><><><><><><><><><><><><><>  - Sed rate  ( or ESR)  = 96       is very elevated &                                    may be elevated from any process of infection or arthritis  <><><><><><><><><><><><><><><><><><><><><><><><><><><><><><><><><>  - As discussed  XR of Left foot ordered at                                                                                [facility]  <><><><><><><><><><><><><><><><><><><><><><><><><><><><><><><><><> <><><><><><><><><><><><><><><><><><><><><><><><><><><><><><><><><>

## 2022-11-19 ENCOUNTER — Ambulatory Visit
Admission: RE | Admit: 2022-11-19 | Discharge: 2022-11-19 | Disposition: A | Discharge status: Home / Self Care | Payer: Managed Care, Other (non HMO) | Source: Ambulatory Visit | Attending: Internal Medicine | Admitting: Internal Medicine

## 2022-11-19 DIAGNOSIS — M79672 Pain in left foot: Secondary | ICD-10-CM

## 2022-11-21 NOTE — Progress Notes (Signed)
<><><><><><><><><><><><><><><><><><><><><><><><><><><><><><><><><> <><><><><><><><><><><><><><><><><><><><><><><><><><><><><><><><><> -   Test results slightly outside the reference range are not unusual. If there is anything important, I will review this with you,  otherwise it is considered normal test values.  If you have further questions,  please do not hesitate to contact me at the office or via My Chart.  <><><><><><><><><><><><><><><><><><><><><><><><><><><><><><><><><> <><><><><><><><><><><><><><><><><><><><><><><><><><><><><><><><><>  -  Xray of foot shows no sign of  ( Osteomyelitis) bone infection - Great !

## 2022-12-02 ENCOUNTER — Ambulatory Visit (INDEPENDENT_AMBULATORY_CARE_PROVIDER_SITE_OTHER): Payer: Managed Care, Other (non HMO) | Admitting: Nurse Practitioner

## 2022-12-02 VITALS — BP 118/78 | HR 75 | Temp 96.9°F | Ht 75.0 in | Wt 290.8 lb

## 2022-12-02 DIAGNOSIS — E66812 Obesity, class 2: Secondary | ICD-10-CM

## 2022-12-02 DIAGNOSIS — I10 Essential (primary) hypertension: Secondary | ICD-10-CM | POA: Diagnosis not present

## 2022-12-02 DIAGNOSIS — E559 Vitamin D deficiency, unspecified: Secondary | ICD-10-CM | POA: Diagnosis not present

## 2022-12-02 DIAGNOSIS — R7309 Other abnormal glucose: Secondary | ICD-10-CM

## 2022-12-02 DIAGNOSIS — Z79899 Other long term (current) drug therapy: Secondary | ICD-10-CM

## 2022-12-02 DIAGNOSIS — K219 Gastro-esophageal reflux disease without esophagitis: Secondary | ICD-10-CM | POA: Diagnosis not present

## 2022-12-02 DIAGNOSIS — E782 Mixed hyperlipidemia: Secondary | ICD-10-CM

## 2022-12-02 DIAGNOSIS — M79672 Pain in left foot: Secondary | ICD-10-CM

## 2022-12-02 NOTE — Progress Notes (Signed)
FOLLOW UP  Assessment and Plan:   Essential hypertension Discussed DASH (Dietary Approaches to Stop Hypertension) DASH diet is lower in sodium than a typical American diet. Cut back on foods that are high in saturated fat, cholesterol, and trans fats. Eat more whole-grain foods, fish, poultry, and nuts Remain active and exercise as tolerated daily.  Monitor BP at home-Call if greater than 130/80.  Check CMP/CBC   Hyperlipidemia, mixed Discussed lifestyle modifications. Recommended diet heavy in fruits and veggies, omega 3's. Decrease consumption of animal meats, cheeses, and dairy products. Remain active and exercise as tolerated. Continue to monitor. Check lipids/TSH   Gastroesophageal reflux disease without esophagitis No suspected reflux complications (Barret/stricture). Lifestyle modification:  wt loss, avoid meals 2-3h before bedtime. Consider eliminating food triggers:  chocolate, caffeine, EtOH, acid/spicy food.   Abnormal glucose Education: Reviewed 'ABCs' of diabetes management  Discussed goals to be met and/or maintained include A1C (<7) Blood pressure (<130/80) Cholesterol (LDL <70) Continue Eye Exam yearly  Continue Dental Exam Q6 mo Discussed dietary recommendations Discussed Physical Activity recommendations Check A1C  Vitamin D deficiency Continue supplement Monitor levels  Medication management All medications discussed and reviewed in full. All questions and concerns regarding medications addressed.     Class 2 severe obesity due to excess calories with serious comorbidity and body mass index (BMI) of 35.0 to 35.9 in adult [facility]) Discussed appropriate BMI Goal of losing 1 lb per month. Diet modification. Physical activity. Encouraged/praised to build confidence.   Left foot pain Urin acid levels WNL 11/17/22 Well healed. Continue to monitor  Orders Placed This Encounter  Procedures   CBC with Differential/Platelet   COMPLETE METABOLIC  PANEL WITH GFR   Magnesium   Lipid panel   Hemoglobin A1c   VITAMIN D 25 Hydroxy (Vit-D Deficiency, Fractures)    Notify office for further evaluation and treatment, questions or concerns if any reported s/s fail to improve.   The patient was advised to call back or seek an in-person evaluation if any symptoms worsen or if the condition fails to improve as anticipated.   Further disposition pending results of labs. Discussed med's effects and SE's.    I discussed the assessment and treatment plan with the patient. The patient was provided an opportunity to ask questions and all were answered. The patient agreed with the plan and demonstrated an understanding of the instructions.  Discussed med's effects and SE's. Screening labs and tests as requested with regular follow-up as recommended.  I provided 25 minutes of face-to-face time during this encounter including counseling, chart review, and critical decision making was preformed.   Future Appointments  Date Time Provider [place]  05/31/2023  3:00 PM Unk Pinto, MD [facility] None    ----------------------------------------------------------------------------------------------------------------------  HPI 61 y.o. male  presents for a follow up on hypertension, cholesterol, diabetes, weight and vitamin D deficiency.   Overall he reports feeling well.    Was recently seen for left foot pain thought to be a gout flare however uric acid levels were WNL.  He was provided a steroid taper with improvement.  Has some mild shin pain.  He is continuing abx, Keflex, has 3 days left.    BMI is Body mass index is 36.35 kg/m., he has not been working on diet and exercise. Wt Readings from Last 3 Encounters:  12/02/22 290 lb 12.8 oz (131.9 kg)  11/17/22 285 lb (129.3 kg)  05/26/22 285 lb (129.3 kg)    His blood pressure has been controlled at home,  today their BP is BP: 118/78  He does not workout. He denies chest pain,  shortness of breath, dizziness.   He is on cholesterol medication Rosuvastatin and denies myalgias. His cholesterol is not at goal with elevated triglycerides. The cholesterol last visit was:   Lab Results  Component Value Date   CHOL 157 05/26/2022   HDL 42 05/26/2022   LDLCALC 82 05/26/2022   TRIG 239 (H) 05/26/2022   CHOLHDL 3.7 05/26/2022    He has not been working on diet and exercise for prediabetes, and denies polydipsia and polyuria. Last A1C in the office was:  Lab Results  Component Value Date   HGBA1C 5.5 05/26/2022   Patient is on Vitamin D supplement.   Lab Results  Component Value Date   VD25OH 32 05/26/2022        Current Medications:  Current Outpatient Medications on File Prior to Visit  Medication Sig   bisoprolol-hydrochlorothiazide (ZIAC) 10-6.25 MG tablet Take  1 tablet  every Night  for BP   cephALEXin (KEFLEX) 500 MG capsule Take  1 capsule  4 x /day  - w / Food  - Meals & Bedtime for Infection   cetirizine (ZYRTEC ALLERGY) 10 MG tablet Take 1 tablet  at Bedtime  for Allergies   Cholecalciferol (VITAMIN D PO) Take 5,000 Units by mouth 2 (two) times daily.   dexamethasone (DECADRON) 4 MG tablet Take 1 tab 3 x day - 3 days, then 2 x day - 3 days, then 1 tab daily   famotidine (PEPCID) 40 MG tablet TAKE 1 TABLET AT BEDTIME FOR ACID REFLUX (Patient taking differently: as needed. Take 1 tablet at bedtime for Acid Reflux)   Multiple Vitamin (MULTI-VITAMINS) TABS Take 1 tablet by mouth daily.   naproxen sodium (ALEVE) 220 MG tablet Take 220 mg by mouth.   olmesartan (BENICAR) 40 MG tablet Take  1/2 to 1 tablet  every Morning  for BP   pantoprazole (PROTONIX) 40 MG tablet Take  1 tablet  Daily  with Supper  for Indigestion & Acid Reflux   rosuvastatin (CRESTOR) 10 MG tablet Take 1 tablet (10 mg total) by mouth daily.   Zinc Chelated 50 MG TABS Take 50 mg by mouth daily.   No current facility-administered medications on file prior to visit.     Allergies: No  Known Allergies   Medical History:  Past Medical History:  Diagnosis Date   Diverticulosis of colon    mild  02/06/2011   GERD (gastroesophageal reflux disease)    Hypertension    Varicose veins    Family history- Reviewed and unchanged Social history- Reviewed and unchanged   Review of Systems:  ROS    Physical Exam: BP 118/78   Pulse 75   Temp (!) 96.9 F (36.1 C)   Ht _0  (1.905 m)   Wt 290 lb 12.8 oz (131.9 kg)   SpO2 95%   BMI 36.35 kg/m  Wt Readings from Last 3 Encounters:  12/02/22 290 lb 12.8 oz (131.9 kg)  11/17/22 285 lb (129.3 kg)  05/26/22 285 lb (129.3 kg)   General Appearance: Well nourished, in no apparent distress. Eyes: PERRLA, EOMs, conjunctiva no swelling or erythema Sinuses: No Frontal/maxillary tenderness ENT/Mouth: Ext aud canals clear, TMs without erythema, bulging. No erythema, swelling, or exudate on post pharynx.  Tonsils not swollen or erythematous. Hearing normal.  Neck: Supple, thyroid normal.  Respiratory: Respiratory effort normal, BS equal bilaterally without rales, rhonchi, wheezing or stridor.  Cardio:  RRR with no MRGs. Brisk peripheral pulses without edema.  Abdomen: Soft, + BS.  Non tender, no guarding, rebound, hernias, masses. Lymphatics: Non tender without lymphadenopathy.  Musculoskeletal: Full ROM, 5/5 strength, Normal gait Skin: Warm, dry without rashes, lesions, ecchymosis.  Neuro: Cranial nerves intact. No cerebellar symptoms.  Psych: Awake and oriented X 3, normal affect, Insight and Judgment appropriate.    Darrol Jump, NP 10:10 AM [facility]

## 2022-12-02 NOTE — Patient Instructions (Signed)

## 2022-12-03 ENCOUNTER — Other Ambulatory Visit: Payer: Self-pay | Admitting: Nurse Practitioner

## 2022-12-03 DIAGNOSIS — D72829 Elevated white blood cell count, unspecified: Secondary | ICD-10-CM

## 2022-12-03 LAB — CBC WITH DIFFERENTIAL/PLATELET
Absolute Monocytes: 858 cells/uL (ref 200–950)
Basophils Absolute: 43 cells/uL (ref 0–200)
Basophils Relative: 0.3 %
Eosinophils Absolute: 272 cells/uL (ref 15–500)
Eosinophils Relative: 1.9 %
HCT: 41.6 % (ref 38.5–50.0)
Hemoglobin: 13.8 g/dL (ref 13.2–17.1)
Lymphs Abs: 2789 cells/uL (ref 850–3900)
MCH: 29.6 pg (ref 27.0–33.0)
MCHC: 33.2 g/dL (ref 32.0–36.0)
MCV: 89.3 fL (ref 80.0–100.0)
MPV: 10.7 fL (ref 7.5–12.5)
Monocytes Relative: 6 %
Neutro Abs: 10339 cells/uL — ABNORMAL HIGH (ref 1500–7800)
Neutrophils Relative %: 72.3 %
Platelets: 268 10*3/uL (ref 140–400)
RBC: 4.66 10*6/uL (ref 4.20–5.80)
RDW: 13.9 % (ref 11.0–15.0)
Total Lymphocyte: 19.5 %
WBC: 14.3 10*3/uL — ABNORMAL HIGH (ref 3.8–10.8)

## 2022-12-03 LAB — COMPLETE METABOLIC PANEL WITH GFR
AG Ratio: 1.4 (calc) (ref 1.0–2.5)
ALT: 37 U/L (ref 9–46)
AST: 21 U/L (ref 10–35)
Albumin: 3.9 g/dL (ref 3.6–5.1)
Alkaline phosphatase (APISO): 80 U/L (ref 35–144)
BUN/Creatinine Ratio: 24 (calc) — ABNORMAL HIGH (ref 6–22)
BUN: 31 mg/dL — ABNORMAL HIGH (ref 7–25)
CO2: 26 mmol/L (ref 20–32)
Calcium: 9.4 mg/dL (ref 8.6–10.3)
Chloride: 103 mmol/L (ref 98–110)
Creat: 1.27 mg/dL (ref 0.70–1.35)
Globulin: 2.7 g/dL (calc) (ref 1.9–3.7)
Glucose, Bld: 97 mg/dL (ref 65–99)
Potassium: 5.1 mmol/L (ref 3.5–5.3)
Sodium: 139 mmol/L (ref 135–146)
Total Bilirubin: 0.5 mg/dL (ref 0.2–1.2)
Total Protein: 6.6 g/dL (ref 6.1–8.1)
eGFR: 64 mL/min/{1.73_m2} (ref >=60)

## 2022-12-03 LAB — LIPID PANEL
Cholesterol: 179 mg/dL (ref <200)
HDL: 58 mg/dL (ref >=40)
LDL Cholesterol (Calc): 91 mg/dL (calc)
Non-HDL Cholesterol (Calc): 121 mg/dL (calc) (ref <130)
Total CHOL/HDL Ratio: 3.1 (calc) (ref <5.0)
Triglycerides: 209 mg/dL — ABNORMAL HIGH (ref <150)

## 2022-12-03 LAB — VITAMIN D 25 HYDROXY (VIT D DEFICIENCY, FRACTURES): Vit D, 25-Hydroxy: 43 ng/mL (ref 30–100)

## 2022-12-03 LAB — HEMOGLOBIN A1C
Hgb A1c MFr Bld: 6 % of total Hgb — ABNORMAL HIGH (ref <5.7)
Mean Plasma Glucose: 126 mg/dL
eAG (mmol/L): 7 mmol/L

## 2022-12-03 LAB — MAGNESIUM: Magnesium: 2.3 mg/dL (ref 1.5–2.5)

## 2023-01-14 ENCOUNTER — Encounter: Payer: Self-pay | Admitting: Nurse Practitioner

## 2023-01-14 NOTE — Telephone Encounter (Signed)
Pt is wanting to know if we can just send in this medication w/o him needing to come in and be seen. I told him I would send a note back to see if he needed an appointment

## 2023-01-15 ENCOUNTER — Other Ambulatory Visit: Payer: Self-pay | Admitting: Nurse Practitioner

## 2023-01-15 MED ORDER — ROSUVASTATIN CALCIUM 10 MG PO TABS: 10.0000 mg | ORAL_TABLET | Freq: Every day | ORAL | 0 refills | Status: DC

## 2023-03-01 ENCOUNTER — Encounter: Payer: Self-pay | Admitting: Nurse Practitioner

## 2023-03-01 DIAGNOSIS — M79672 Pain in left foot: Secondary | ICD-10-CM

## 2023-03-17 ENCOUNTER — Encounter: Payer: Self-pay | Admitting: Nurse Practitioner

## 2023-03-22 ENCOUNTER — Ambulatory Visit (INDEPENDENT_AMBULATORY_CARE_PROVIDER_SITE_OTHER): Payer: Managed Care, Other (non HMO) | Admitting: Podiatry

## 2023-03-22 ENCOUNTER — Ambulatory Visit (INDEPENDENT_AMBULATORY_CARE_PROVIDER_SITE_OTHER): Payer: Managed Care, Other (non HMO)

## 2023-03-22 DIAGNOSIS — M10072 Idiopathic gout, left ankle and foot: Secondary | ICD-10-CM | POA: Diagnosis not present

## 2023-03-22 DIAGNOSIS — M79671 Pain in right foot: Secondary | ICD-10-CM

## 2023-03-22 DIAGNOSIS — M79672 Pain in left foot: Secondary | ICD-10-CM | POA: Diagnosis not present

## 2023-03-22 NOTE — Progress Notes (Signed)
   Chief Complaint  Patient presents with   Bunions    Patient came in today for left foot bunion pain, bilateral heel pain and ankle swelling, rate of pain 9 out of 10, possible gout, last 3 weeks, X-Rays done today,     HPI: 62 y.o. male presenting today as a new patient for evaluation of possible gout of the left foot as well as some right heel pain.  Patient states that recently he has developed pain and tenderness associated to the left great toe joint.  His PCP was concerned for possible infection because he had leukocytosis at that time.  He was prescribed a steroid Dosepak as well as antibiotics and the symptoms resolved.  He presents to have it evaluated today  Patient also states that he does have intermittent right heel pain.  He has not done anything specifically for treatment.  The pain is somewhat  Past Medical History:  Diagnosis Date   Diverticulosis of colon    mild  02/06/2011   GERD (gastroesophageal reflux disease)    Hypertension    Varicose veins     Past Surgical History:  Procedure Laterality Date   BASAL CELL CARCINOMA EXCISION     removed from back,shoulder blade, and lower back   CHOLECYSTECTOMY     COLONOSCOPY     PILONIDAL CYST EXCISION     VARICOSE VEIN SURGERY     right leg    No Known Allergies   Physical Exam: General: The patient is alert and oriented x3 in no acute distress.  Dermatology: Skin is warm, dry and supple bilateral lower extremities. Negative for open lesions or macerations.  Vascular: Palpable pedal pulses bilaterally. Capillary refill within normal limits.  Negative for any significant edema or erythema  Neurological: Light touch and protective threshold grossly intact  Musculoskeletal Exam: No pedal deformities noted.  There is minimal tenderness with palpation along the plantar heel right  Radiographic Exam LT foot 03/22/2023:  Normal osseous mineralization. Joint spaces preserved. No fracture/dislocation/boney destruction.     Assessment: 1.  Suspected gout first MTP left; resolved 2.  Plantar fasciitis/bilateral heel pain. RT > LT   Plan of Care:  1. Patient evaluated. X-Rays reviewed.  2.  Explained the pathology and etiology and as well as management of gout.  Recommend educating himself on foods to avoid for gout 3.  Will provide prescription for colchicine 0.6 mg daily as needed 4.  Advised against going barefoot.  Recommend good supportive shoes and sneakers that support the arch of the foot and offload pressure from the heels 5.  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 Felecia Shelling, DPM    [address] [place], [place] [address]                Office [phone]  Fax [phone]

## 2023-04-12 ENCOUNTER — Other Ambulatory Visit: Payer: Self-pay | Admitting: Podiatry

## 2023-04-12 DIAGNOSIS — M79671 Pain in right foot: Secondary | ICD-10-CM

## 2023-04-12 DIAGNOSIS — M10072 Idiopathic gout, left ankle and foot: Secondary | ICD-10-CM

## 2023-05-02 ENCOUNTER — Encounter: Payer: Self-pay | Admitting: Podiatry

## 2023-05-26 ENCOUNTER — Other Ambulatory Visit: Payer: Self-pay | Admitting: Nurse Practitioner

## 2023-05-26 ENCOUNTER — Other Ambulatory Visit: Payer: Self-pay | Admitting: Internal Medicine

## 2023-05-26 DIAGNOSIS — I10 Essential (primary) hypertension: Secondary | ICD-10-CM

## 2023-05-26 MED ORDER — BISOPROLOL-HYDROCHLOROTHIAZIDE 10-6.25 MG PO TABS
ORAL_TABLET | ORAL | 3 refills | Status: DC
Start: 2023-05-26 — End: 2023-05-28

## 2023-05-26 MED ORDER — BISOPROLOL-HYDROCHLOROTHIAZIDE 10-6.25 MG PO TABS
ORAL_TABLET | ORAL | 3 refills | Status: DC
Start: 2023-05-26 — End: 2023-05-26

## 2023-05-28 ENCOUNTER — Other Ambulatory Visit: Payer: Self-pay | Admitting: Internal Medicine

## 2023-05-28 DIAGNOSIS — I10 Essential (primary) hypertension: Secondary | ICD-10-CM

## 2023-05-28 MED ORDER — BISOPROLOL-HYDROCHLOROTHIAZIDE 10-6.25 MG PO TABS: ORAL_TABLET | ORAL | 3 refills | Status: DC

## 2023-05-30 ENCOUNTER — Encounter: Payer: Self-pay | Admitting: Internal Medicine

## 2023-05-30 NOTE — Progress Notes (Signed)
Annual  Screening/Preventative Visit  & Comprehensive Evaluation & Examination   Future Appointments  Date Time Provider Department  05/31/2023  3:00 PM Lucky Cowboy, MD [facility]  06/02/2023  4:15 PM Felecia Shelling, DPM [facility]  06/08/2024  3:00 PM Lucky Cowboy, MD [facility]            This very nice 62 y.o. MWM  with labile HTN, HLD,  Morbid Obesity (BMI 37+) , Prediabetes and Vitamin D Deficiency  who presents for a Screening /Preventative Visit & comprehensive evaluation and management of multiple medical co-morbidities.  Patient has hx/o GERD .  Patient also has a remote hx/o Gout which has been Asx.         Patient requests sleep study at recommendation of his RN wife who has noted increased snoring & apneic episodes. Patient does also report awakening feeling exhausted.            HTN predates circa Jan 2017. Patient's BP has been controlled at home.  Today's BP is  at goal  - 136/76 .   Patient had a   remote negative Stress Cardiolite scan. Patient denies any cardiac symptoms as chest pain, palpitations, shortness of breath, dizziness or ankle swelling.        Patient's hyperlipidemia is not controlled with diet.  Last lipids were at goal except elevated Trig's :  Lab Results  Component Value Date   CHOL 179 12/02/2022   HDL 58 12/02/2022   LDLCALC 91 12/02/2022   TRIG 209 (H) 12/02/2022   CHOLHDL 3.1 12/02/2022         Patient has Moderate Obesity ( BMI 37+ ) and consequent  prediabetes (A1c 5.8% /2017) and patient denies reactive hypoglycemic symptoms, visual blurring, diabetic polys or paresthesias. Last A1c was  not at    goal:   Lab Results  Component Value Date   HGBA1C 6.0 (H) 12/02/2022   Wt Readings from Last 3 Encounters:  05/31/23 299 lb   12/02/22 290 lb   11/17/22 285 lb                                  Patient has hx/o Low Testosterone ("171" /2017 - "122" /2018 and "173" /2019) and has  deferred replacement therapy.          Finally, patient has history of Vitamin D Deficiency ("26" /2017) and last vitamin D was not at at goal (70-100):   Lab Results  Component Value Date   VD25OH 43 12/02/2022       Current Outpatient Medications  Medication Instructions   bisoprolol-hctz (ZIAC) 10-6.25 MG tablet Take 1 tablet every Night    cetirizine 10 MG tablet Take 1 tablet  at Bedtime     VITAMIN D   5,000 Units 2 times daily   Multiple Vitamin Take 1 tablet daily.   olmesartan  40 MG tablet Take  1/2 to 1 tablet  every Morning    rosuvastatin  10 mg Daily   Zinc  50 mg Daily     No Known Allergies    Past Medical History:  Diagnosis Date   Diverticulosis of colon    mild  02/06/2011   GERD (gastroesophageal reflux disease)    Varicose veins      Health Maintenance  Topic Date Due   Pneumococcal Vaccine 32-70 Years old (1 - PCV) Never done   Zoster Vaccines- Shingrix (  1 of 2) Never done   COVID-19 Vaccine (4 - Booster for Pfizer series) 01/26/2021   INFLUENZA VACCINE  07/14/2021   COLONOSCOPY  08/21/2021   TETANUS/TDAP  12/30/2025   Hepatitis C Screening  Completed   HIV Screening  Completed   HPV VACCINES  Aged Out     Immunization History  Administered Date(s) Administered   Influenza, Quadrivalent, Recomb 10/28/2019   Influenza,inj,Quad  10/01/2018, 11/23/2020   Influenza 09/26/2016, 10/14/2017   PFIZER  SARS-COV-2 Vacc 02/29/2020, 03/21/2020, 10/26/2020   PPD Test 02/03/2018, 04/12/2019, 05/22/2020   Tdap 12/31/2015    Last Colon - 09/24/2021 - Dr Russella Dar - Recommended 51yr   f/u - 09/24/2024   Past Surgical History:  Procedure Laterality Date   BASAL CELL CARCINOMA EXCISION     removed from back,shoulder blade, and lower back   CHOLECYSTECTOMY     PILONIDAL CYST EXCISION     VARICOSE VEIN SURGERY     right leg     Family History  Problem Relation Age of Onset   Colon cancer Father    Cancer Father    Alzheimer's disease Mother    Colon cancer Paternal Aunt     Colon cancer Paternal Grandmother    Coronary artery disease Neg Hx      Social History   Socioeconomic History   Marital status: Married    Spouse name: Elnita Maxwell   Number of children: 2 sons & 2 daughters  Occupational History   Engineer  Tobacco Use   Smoking status: Never   Smokeless tobacco: Never   Tobacco comments:    10 yrs ago  Substance and Sexual Activity   Alcohol use: Yes    Alcohol/week: 2.0 - 3.0 standard drinks    Types: 2 - 3 Glasses of wine per week    Comment: 2-3 drinks weekly   Drug use: No   Sexual activity: Not on file      ROS Constitutional: Denies fever, chills, weight loss/gain, headaches, insomnia,  night sweats or change in appetite. Does c/o fatigue. Eyes: Denies redness, blurred vision, diplopia, discharge, itchy or watery eyes.  ENT: Denies discharge, congestion, post nasal drip, epistaxis, sore throat, earache, hearing loss, dental pain, Tinnitus, Vertigo, Sinus pain or snoring.  Cardio: Denies chest pain, palpitations, irregular heartbeat, syncope, dyspnea, diaphoresis, orthopnea, PND, claudication or edema Respiratory: denies cough, dyspnea, DOE, pleurisy, hoarseness, laryngitis or wheezing.  Gastrointestinal: Denies dysphagia, heartburn, reflux, water brash, pain, cramps, nausea, vomiting, bloating, diarrhea, constipation, hematemesis, melena, hematochezia, jaundice or hemorrhoids Genitourinary: Denies dysuria, frequency, urgency, nocturia, hesitancy, discharge, hematuria or flank pain Musculoskeletal: Denies arthralgia, myalgia, stiffness, Jt. Swelling, pain, limp or strain/sprain. Denies Falls. Skin: Denies puritis, rash, hives, warts, acne, eczema or change in skin lesion Neuro: No weakness, tremor, incoordination, spasms, paresthesia or pain Psychiatric: Denies confusion, memory loss or sensory loss. Denies Depression. Endocrine: Denies change in weight, skin, hair change, nocturia, and paresthesia, diabetic polys, visual blurring or hyper  / hypo glycemic episodes.  Heme/Lymph: No excessive bleeding, bruising or enlarged lymph nodes.   Physical Exam  BP 136/76   P 68   T 97.9 F   R16   Ht 6\' 3"    Wt 299 lb    SpO2 98%   BMI 37.37 kg/m   General Appearance: OVER nourished and well groomed and in no apparent distress.  Eyes: PERRLA, EOMs, conjunctiva no swelling or erythema, normal fundi and vessels. Sinuses: No frontal/maxillary tenderness ENT/Mouth: EACs patent / TMs  nl. Nares clear  without erythema, swelling, mucoid exudates.  Mallampati 2-3. Oral hygiene is good. No erythema, swelling, or exudate. Tongue normal, non-obstructing. Tonsils not swollen or erythematous. Hearing normal.  Neck: Supple, thyroid not palpable. No bruits, nodes or JVD. Respiratory: Respiratory effort normal.  BS equal and clear bilateral without rales, rhonci, wheezing or stridor. Cardio: Heart sounds are normal with regular rate and rhythm and no murmurs, rubs or gallops. Peripheral pulses are normal and equal bilaterally with 2+ pitting pretibial/ankle edema. No aortic or femoral bruits. Chest: symmetric with normal excursions and percussion.  Abdomen: Soft, Rotund with Nl bowel sounds. Nontender, no guarding, rebound, hernias, masses, or organomegaly.  Lymphatics: Non tender without lymphadenopathy.  Musculoskeletal: Full ROM all peripheral extremities, joint stability, 5/5 strength, and normal gait & arm swing. Skin: Warm and dry without rashes, lesions, cyanosis, clubbing or  ecchymosis.  Neuro: Cranial nerves intact, reflexes equal bilaterally. Normal muscle tone, no cog wheeling, no cerebellar symptoms. Sensation intact.  Fine high frequency low amplitude of thumbs at rest , L>R.  Pysch: Alert and oriented X 3 with normal affect, insight and judgment appropriate.   Assessment and Plan  1. Annual Preventative/Screening Exam    2. Essential hypertension  - EKG 12-Lead - [place], RETROPERITNL ABD,  LTD - CBC with  Differential/Platelet - COMPLETE METABOLIC PANEL WITH GFR - Magnesium - TSH      - Demadex 20 mg   added for his Venous Insufficiency edema  3. Hyperlipidemia, mixed  - EKG 12-Lead - [place], RETROPERITNL ABD,  LTD - Lipid panel - TSH   4. Abnormal glucose  - EKG 12-Lead - [place], RETROPERITNL ABD,  LTD - Hemoglobin A1c - Insulin, random   5. Vitamin D deficiency  - VITAMIN D 25 Hydroxy    6. Idiopathic gout  - Uric acid   7. Gastroesophageal reflux disease without esophagitis  - POC Hemoccult Bld/Stl    8. 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)  - Indicated willingness to try Phentermine /Topiramate combo for weight loss.    9. Non-restorative sleep  - Ambulatory referral to Sleep Studies   10. OSA (obstructive sleep apnea)  - Ambulatory referral to Sleep Studies   11. Testosterone deficiency  - Testosterone   12. Screening for colorectal cancer  - POC Hemoccult Bld/Stl (3-Cd Home Screen); Future   13. Prostate cancer screening  - PSA   14. Screening-pulmonary TB  - TB Skin Test   15. Screening for heart disease  - EKG 12-Lead  16. Screening for AAA (aortic abdominal aneurysm)  - [place], RETROPERITNL ABD,  LTD   17. Fatigue  - Iron, Total/Total Iron Binding Cap - Vitamin B12 - CBC with Differential/Platelet - TSH   18. Medication management  - Urinalysis, Routine w reflex microscopic - Microalbumin / creatinine urine ratio - CBC with Differential/Platelet - COMPLETE METABOLIC PANEL WITH GFR - Magnesium - Lipid panel - TSH - Hemoglobin A1c - Insulin, random - VITAMIN D 25 Hydroxy  - Uric acid        Patient was counseled in prudent diet, weight control to achieve/maintain BMI less than 25, BP monitoring, regular exercise and medications as discussed.  Discussed med effects and SE's. Routine screening labs and tests as requested with  regular follow-up as recommended.        Also with c/o snoring & Daytime excessive sleepiness , offered referral for sleep study which he also declined  for now.   Over 40 minutes of exam, counseling, chart review and high complex critical decision making was performed   Marinus Maw, MD

## 2023-05-30 NOTE — Patient Instructions (Signed)
Due to recent changes in healthcare laws, you may see the results of your imaging and laboratory studies on MyChart before your provider has had a chance to review them.  We understand that in some cases there may be results that are confusing or concerning to you. Not all laboratory results come back in the same time frame and the provider may be waiting for multiple results in order to interpret others.  Please give us 48 hours in order for your provider to thoroughly review all the results before contacting the office for clarification of your results.   ++++++++++++++++++++++++++++++++++++++  Vit D  & Vit C 1,000 mg   are recommended to help protect  against the Covid-19 and other Corona viruses.    Also it's recommended  to take  Zinc 50 mg  to help  protect against the Covid-19   and best place to get  is also on [employer]  and don't pay more than 6-8 cents /pill !  =============================== Coronavirus (COVID-19) Are you at risk?  Are you at risk for the Coronavirus (COVID-19)?  To be considered HIGH RISK for Coronavirus (COVID-19), you have to meet the following criteria:  Traveled to [place], [place], [place], [place] or [place]; or in the [place] to [place], [place], [place]  or [place]; and have fever, cough, and shortness of breath within the last 2 weeks of travel OR Been in close contact with a person diagnosed with COVID-19 within the last 2 weeks and have  fever, cough,and shortness of breath  IF YOU DO NOT MEET THESE CRITERIA, YOU ARE CONSIDERED LOW RISK FOR COVID-19.  What to do if you are HIGH RISK for COVID-19?  If you are having a medical emergency, call 911. Seek medical care right away. Before you go to a doctor's office, urgent care or emergency department,  call ahead and tell them about your recent travel, contact with someone diagnosed with COVID-19   and your symptoms.  You should receive instructions from your physician's office  regarding next steps of care.  When you arrive at healthcare provider, tell the healthcare staff immediately you have returned from  visiting [place], [place], [place], [place] or [place]; or traveled in the [place] to [place], [place],  [place] or [place] in the last two weeks or you have been in close contact with a person diagnosed with  COVID-19 in the last 2 weeks.   Tell the health care staff about your symptoms: fever, cough and shortness of breath. After you have been seen by a medical provider, you will be either: Tested for (COVID-19) and discharged home on quarantine except to seek medical care if  symptoms worsen, and asked to  Stay home and avoid contact with others until you get your results (4-5 days)  Avoid travel on public transportation if possible (such as bus, train, or airplane) or Sent to the Emergency Department by EMS for evaluation, COVID-19 testing  and  possible admission depending on your condition and test results.  What to do if you are LOW RISK for COVID-19?  Reduce your risk of any infection by using the same precautions used for avoiding the common cold or flu:  Wash your hands often with soap and warm water for at least 20 seconds.  If soap and water are not readily available,  use an alcohol-based hand sanitizer with at least 60% alcohol.  If coughing or sneezing, cover your mouth and nose by coughing   or sneezing into the elbow areas of your shirt or coat,  into a tissue or into your sleeve (not your hands). Avoid shaking hands with others and consider head nods or verbal greetings only. Avoid touching your eyes, nose, or mouth with unwashed hands.  Avoid close contact with people who are sick. Avoid places or events with large numbers of people in one location, like concerts or sporting events. Carefully consider travel plans you have or are making. If you are planning any travel outside or inside the [place], visit the CDC's Travelers' Health  webpage for the latest health notices. If you have some symptoms but not all symptoms, continue to monitor at home and seek medical attention  if your symptoms worsen. If you are having a medical emergency, call 911. >>>>>>>>>>>>>>>>>>>>>>>>>>>> Preventive Care for Adults  A healthy lifestyle and preventive care can promote health and wellness. Preventive health guidelines for men include the following key practices: A routine yearly physical is a good way to check with your health care provider about your health and preventative screening. It is a chance to share any concerns and updates on your health and to receive a thorough exam. Visit your dentist for a routine exam and preventative care every 6 months. Brush your teeth twice a day and floss once a day. Good oral hygiene prevents tooth decay and gum disease. The frequency of eye exams is based on your age, health, family medical history, use of contact lenses, and other factors. Follow your health care provider's recommendations for frequency of eye exams. Eat a healthy diet. Foods such as vegetables, fruits, whole grains, low-fat dairy products, and lean protein foods contain the nutrients you need without too many calories. Decrease your intake of foods high in solid fats, added sugars, and salt. Eat the right amount of calories for you. Get information about a proper diet from your health care provider, if necessary. Regular physical exercise is one of the most important things you can do for your health. Most adults should get at least 150 minutes of moderate-intensity exercise (any activity that increases your heart rate and causes you to sweat) each week. In addition, most adults need muscle-strengthening exercises on 2 or more days a week. Maintain a healthy weight. The body mass index (BMI) is a screening tool to identify possible weight problems. It provides an estimate of body fat based on height and weight. Your health care provider can  find your BMI and can help you achieve or maintain a healthy weight. For adults 20 years and older: A BMI below 18.5 is considered underweight. A BMI of 18.5 to 24.9 is normal. A BMI of 25 to 29.9 is considered overweight. A BMI of 30 and above is considered obese. Maintain normal blood lipids and cholesterol levels by exercising and minimizing your intake of saturated fat. Eat a balanced diet with plenty of fruit and vegetables. Blood tests for lipids and cholesterol should begin at age 20 and be repeated every 5 years. If your lipid or cholesterol levels are high, you are over 50, or you are at high risk for heart disease, you may need your cholesterol levels checked more frequently. Ongoing high lipid and cholesterol levels should be treated with medicines if diet and exercise are not working. If you smoke, find out from your health care provider how to quit. If you do not use tobacco, do not start. Lung cancer screening is recommended for adults aged 55-80 years who are at high risk for   developing lung cancer because of a history of smoking. A yearly low-dose CT scan of the lungs is recommended for people who have at least a 30-pack-year history of smoking and are a current smoker or have quit within the past 15 years. A pack year of smoking is smoking an average of 1 pack of cigarettes a day for 1 year (for example: 1 pack a day for 30 years or 2 packs a day for 15 years). Yearly screening should continue until the smoker has stopped smoking for at least 15 years. Yearly screening should be stopped for people who develop a health problem that would prevent them from having lung cancer treatment. If you choose to drink alcohol, do not have more than 2 drinks per day. One drink is considered to be 12 ounces (355 mL) of beer, 5 ounces (148 mL) of wine, or 1.5 ounces (44 mL) of liquor. Avoid use of street drugs. Do not share needles with anyone. Ask for help if you need support or instructions about  stopping the use of drugs. High blood pressure causes heart disease and increases the risk of stroke. Your blood pressure should be checked at least every 1-2 years. Ongoing high blood pressure should be treated with medicines, if weight loss and exercise are not effective. If you are 45-79 years old, ask your health care provider if you should take aspirin to prevent heart disease. Diabetes screening involves taking a blood sample to check your fasting blood sugar level. This should be done once every 3 years, after age 45, if you are within normal weight and without risk factors for diabetes. Testing should be considered at a younger age or be carried out more frequently if you are overweight and have at least 1 risk factor for diabetes. Colorectal cancer can be detected and often prevented. Most routine colorectal cancer screening begins at the age of 50 and continues through age 75. However, your health care provider may recommend screening at an earlier age if you have risk factors for colon cancer. On a yearly basis, your health care provider may provide home test kits to check for hidden blood in the stool. Use of a small camera at the end of a tube to directly examine the colon (sigmoidoscopy or colonoscopy) can detect the earliest forms of colorectal cancer. Talk to your health care provider about this at age 50, when routine screening begins. Direct exam of the colon should be repeated every 5-10 years through age 75, unless early forms of precancerous polyps or small growths are found.  Talk with your health care provider about prostate cancer screening. Testicular cancer screening isrecommended for adult males. Screening includes self-exam, a health care provider exam, and other screening tests. Consult with your health care provider about any symptoms you have or any concerns you have about testicular cancer. Use sunscreen. Apply sunscreen liberally and repeatedly throughout the day. You should  seek shade when your shadow is shorter than you. Protect yourself by wearing long sleeves, pants, a wide-brimmed hat, and sunglasses year round, whenever you are outdoors. Once a month, do a whole-body skin exam, using a mirror to look at the skin on your back. Tell your health care provider about new moles, moles that have irregular borders, moles that are larger than a pencil eraser, or moles that have changed in shape or color. Stay current with required vaccines (immunizations). Influenza vaccine. All adults should be immunized every year. Tetanus, diphtheria, and acellular pertussis (Td, Tdap) vaccine. An   adult who has not previously received Tdap or who does not know his vaccine status should receive 1 dose of Tdap. This initial dose should be followed by tetanus and diphtheria toxoids (Td) booster doses every 10 years. Adults with an unknown or incomplete history of completing a 3-dose immunization series with Td-containing vaccines should begin or complete a primary immunization series including a Tdap dose. Adults should receive a Td booster every 10 years. Varicella vaccine. An adult without evidence of immunity to varicella should receive 2 doses or a second dose if he has previously received 1 dose. Human papillomavirus (HPV) vaccine. Males aged 13-21 years who have not received the vaccine previously should receive the 3-dose series. Males aged 22-26 years may be immunized. Immunization is recommended through the age of 26 years for any male who has sex with males and did not get any or all doses earlier. Immunization is recommended for any person with an immunocompromised condition through the age of 26 years if he did not get any or all doses earlier. During the 3-dose series, the second dose should be obtained 4-8 weeks after the first dose. The third dose should be obtained 24 weeks after the first dose and 16 weeks after the second dose. Zoster vaccine. One dose is recommended for adults  aged 60 years or older unless certain conditions are present.  PREVNAR  - Pneumococcal 13-valent conjugate (PCV13) vaccine. When indicated, a person who is uncertain of his immunization history and has no record of immunization should receive the PCV13 vaccine. An adult aged 19 years or older who has certain medical conditions and has not been previously immunized should receive 1 dose of PCV13 vaccine. This PCV13 should be followed with a dose of pneumococcal polysaccharide (PPSV23) vaccine. The PPSV23 vaccine dose should be obtained at least 1 r more year(s) after the dose of PCV13 vaccine. An adult aged 19 years or older who has certain medical conditions and previously received 1 or more doses of PPSV23 vaccine should receive 1 dose of PCV13. The PCV13 vaccine dose should be obtained 1 or more years after the last PPSV23 vaccine dose.  PNEUMOVAX - Pneumococcal polysaccharide (PPSV23) vaccine. When PCV13 is also indicated, PCV13 should be obtained first. All adults aged 65 years and older should be immunized. An adult younger than age 65 years who has certain medical conditions should be immunized. Any person who resides in a nursing home or long-term care facility should be immunized. An adult smoker should be immunized. People with an immunocompromised condition and certain other conditions should receive both PCV13 and PPSV23 vaccines. People with human immunodeficiency virus (HIV) infection should be immunized as soon as possible after diagnosis. Immunization during chemotherapy or radiation therapy should be avoided. Routine use of PPSV23 vaccine is not recommended for American Indians, [place] Natives, or people younger than 65 years unless there are medical conditions that require PPSV23 vaccine. When indicated, people who have unknown immunization and have no record of immunization should receive PPSV23 vaccine. One-time revaccination 5 years after the first dose of PPSV23 is recommended for people  aged 19-64 years who have chronic kidney failure, nephrotic syndrome, asplenia, or immunocompromised conditions. People who received 1-2 doses of PPSV23 before age 65 years should receive another dose of PPSV23 vaccine at age 65 years or later if at least 5 years have passed since the previous dose. Doses of PPSV23 are not needed for people immunized with PPSV23 at or after age 65 years.  Hepatitis A vaccine.   Adults who wish to be protected from this disease, have certain high-risk conditions, work with hepatitis A-infected animals, work in hepatitis A research labs, or travel to or work in countries with a high rate of hepatitis A should be immunized. Adults who were previously unvaccinated and who anticipate close contact with an international adoptee during the first 60 days after arrival in the [place] from a country with a high rate of hepatitis A should be immunized.  Hepatitis B vaccine. Adults should be immunized if they wish to be protected from this disease, have certain high-risk conditions, may be exposed to blood or other infectious body fluids, are household contacts or sex partners of hepatitis B positive people, are clients or workers in certain care facilities, or travel to or work in countries with a high rate of hepatitis B.  Preventive Service / Frequency  Ages 40 to 64 Blood pressure check. Lipid and cholesterol check Lung cancer screening. / Every year if you are aged 55-80 years and have a 30-pack-year history of smoking and currently smoke or have quit within the past 15 years. Yearly screening is stopped once you have quit smoking for at least 15 years or develop a health problem that would prevent you from having lung cancer treatment. Fecal occult blood test (FOBT) of stool. / Every year beginning at age 50 and continuing until age 75. You may not have to do this test if you get a colonoscopy every 10 years. Flexible sigmoidoscopy** or colonoscopy.** / Every 5 years for  a flexible sigmoidoscopy or every 10 years for a colonoscopy beginning at age 50 and continuing until age 75. Screening for abdominal aortic aneurysm (AAA)  by ultrasound is recommended for people who have history of high blood pressure or who are current or former smokers. +++++++++++ Recommend Adult Low Dose Aspirin or  coated  Aspirin 81 mg daily  To reduce risk of Colon Cancer 40 %,  Skin Cancer 26 % ,  Malignant Melanoma 46%  and  Pancreatic cancer 60% ++++++++++++++++++++ Vitamin D goal  is between 70-100.  Please make sure that you are taking your Vitamin D as directed.  It is very important as a natural anti-inflammatory  helping hair, skin, and nails, as well as reducing stroke and heart attack risk.  It helps your bones and helps with mood. It also decreases numerous cancer risks so please take it as directed.  Low Vit D is associated with a 200-300% higher risk for CANCER  and 200-300% higher risk for HEART   ATTACK  &  STROKE.   ...................................... It is also associated with higher death rate at younger ages,  autoimmune diseases like Rheumatoid arthritis, Lupus, Multiple Sclerosis.    Also many other serious conditions, like depression, Alzheimer's Dementia, infertility, muscle aches, fatigue, fibromyalgia - just to name a few. +++++++++++++++++++++ Recommend the book "The END of DIETING" by Dr Joel Fuhrman  & the book "The END of DIABETES " by Dr Joel Fuhrman At [employer] - get book & Audio CD's    Being diabetic has a  300% increased risk for heart attack, stroke, cancer, and alzheimer- type vascular dementia. It is very important that you work harder with diet by avoiding all foods that are white. Avoid white rice (brown & wild rice is OK), white potatoes (sweetpotatoes in moderation is OK), White bread or wheat bread or anything made out of white flour like bagels, donuts, rolls, buns, biscuits, cakes, pastries, cookies, pizza crust, and pasta (made    from white flour & egg whites) - vegetarian pasta or spinach or wheat pasta is OK. Multigrain breads like Arnold's or Pepper[employer], or multigrain sandwich thins or flatbreads.  Diet, exercise and weight loss can reverse and cure diabetes in the early stages.  Diet, exercise and weight loss is very important in the control and prevention of complications of diabetes which affects every system in your body, ie. Brain - dementia/stroke, eyes - glaucoma/blindness, heart - heart attack/heart failure, kidneys - dialysis, stomach - gastric paralysis, intestines - malabsorption, nerves - severe painful neuritis, circulation - gangrene & loss of a leg(s), and finally cancer and Alzheimers.    I recommend avoid fried & greasy foods,  sweets/candy, white rice (brown or wild rice or Quinoa is OK), white potatoes (sweet potatoes are OK) - anything made from white flour - bagels, doughnuts, rolls, buns, biscuits,white and wheat breads, pizza crust and traditional pasta made of white flour & egg white(vegetarian pasta or spinach or wheat pasta is OK).  Multi-grain bread is OK - like multi-grain flat bread or sandwich thins. Avoid alcohol in excess. Exercise is also important.    Eat all the vegetables you want - avoid meat, especially red meat and dairy - especially cheese.  Cheese is the most concentrated form of trans-fats which is the worst thing to clog up our arteries. Veggie cheese is OK which can be found in the fresh produce section at [employer] or [employer] or [employer]  ++++++++++++++++++++++ DASH Eating Plan  DASH stands for "Dietary Approaches to Stop Hypertension."   The DASH eating plan is a healthy eating plan that has been shown to reduce high blood pressure (hypertension). Additional health benefits may include reducing the risk of type 2 diabetes mellitus, heart disease, and stroke. The DASH eating plan may also help with weight loss. WHAT DO I NEED TO KNOW ABOUT THE DASH EATING PLAN? For  the DASH eating plan, you will follow these general guidelines: Choose foods with a percent daily value for sodium of less than 5% (as listed on the food label). Use salt-free seasonings or herbs instead of table salt or sea salt. Check with your health care provider or pharmacist before using salt substitutes. Eat lower-sodium products, often labeled as "lower sodium" or "no salt added." Eat fresh foods. Eat more vegetables, fruits, and low-fat dairy products. Choose whole grains. Look for the word "whole" as the first word in the ingredient list. Choose fish  Limit sweets, desserts, sugars, and sugary drinks. Choose heart-healthy fats. Eat veggie cheese  Eat more home-cooked food and less restaurant, buffet, and fast food. Limit fried foods. Cook foods using methods other than frying. Limit canned vegetables. If you do use them, rinse them well to decrease the sodium. When eating at a restaurant, ask that your food be prepared with less salt, or no salt if possible.                      WHAT FOODS CAN I EAT? Read Dr Joel Fuhrman's books on The End of Dieting & The End of Diabetes  Grains Whole grain or whole wheat bread. Brown rice. Whole grain or whole wheat pasta. Quinoa, bulgur, and whole grain cereals. Low-sodium cereals. Corn or whole wheat flour tortillas. Whole grain cornbread. Whole grain crackers. Low-sodium crackers.  Vegetables Fresh or frozen vegetables (raw, steamed, roasted, or grilled). Low-sodium or reduced-sodium tomato and vegetable juices. Low-sodium or reduced-sodium tomato sauce and paste. Low-sodium or reduced-sodium canned vegetables.     Fruits All fresh, canned (in natural juice), or frozen fruits.  Protein Products  All fish and seafood.  Dried beans, peas, or lentils. Unsalted nuts and seeds. Unsalted canned beans.  Dairy Low-fat dairy products, such as skim or 1% milk, 2% or reduced-fat cheeses, low-fat ricotta or cottage cheese, or plain low-fat yogurt.  Low-sodium or reduced-sodium cheeses.  Fats and Oils Tub margarines without trans fats. Light or reduced-fat mayonnaise and salad dressings (reduced sodium). Avocado. Safflower, olive, or canola oils. Natural peanut or almond butter.  Other Unsalted popcorn and pretzels. The items listed above may not be a complete list of recommended foods or beverages. Contact your dietitian for more options.  +++++++++++++++++++  WHAT FOODS ARE NOT RECOMMENDED? Grains/ White flour or wheat flour White bread. White pasta. White rice. Refined cornbread. Bagels and croissants. Crackers that contain trans fat.  Vegetables  Creamed or fried vegetables. Vegetables in a . Regular canned vegetables. Regular canned tomato sauce and paste. Regular tomato and vegetable juices.  Fruits Dried fruits. Canned fruit in light or heavy syrup. Fruit juice.  Meat and Other Protein Products Meat in general - RED meat & White meat.  Fatty cuts of meat. Ribs, chicken wings, all processed meats as bacon, sausage, bologna, salami, fatback, hot dogs, bratwurst and packaged luncheon meats.  Dairy Whole or 2% milk, cream, half-and-half, and cream cheese. Whole-fat or sweetened yogurt. Full-fat cheeses or blue cheese. Non-dairy creamers and whipped toppings. Processed cheese, cheese spreads, or cheese curds.  Condiments Onion and garlic salt, seasoned salt, table salt, and sea salt. Canned and packaged gravies. Worcestershire sauce. Tartar sauce. Barbecue sauce. Teriyaki sauce. Soy sauce, including reduced sodium. Steak sauce. Fish sauce. Oyster sauce. Cocktail sauce. Horseradish. Ketchup and mustard. Meat flavorings and tenderizers. Bouillon cubes. Hot sauce. Tabasco sauce. Marinades. Taco seasonings. Relishes.  Fats and Oils Butter, stick margarine, lard, shortening and bacon fat. Coconut, palm kernel, or palm oils. Regular salad dressings.  Pickles and olives. Salted popcorn and pretzels.  The items listed above may not  be a complete list of foods and beverages to avoid.   

## 2023-05-31 ENCOUNTER — Encounter: Payer: Self-pay | Admitting: Internal Medicine

## 2023-05-31 ENCOUNTER — Ambulatory Visit (INDEPENDENT_AMBULATORY_CARE_PROVIDER_SITE_OTHER): Payer: Managed Care, Other (non HMO) | Admitting: Internal Medicine

## 2023-05-31 VITALS — BP 136/76 | HR 68 | Temp 97.9°F | Resp 16 | Ht 75.0 in | Wt 299.0 lb

## 2023-05-31 DIAGNOSIS — R7309 Other abnormal glucose: Secondary | ICD-10-CM

## 2023-05-31 DIAGNOSIS — Z0000 Encounter for general adult medical examination without abnormal findings: Secondary | ICD-10-CM | POA: Diagnosis not present

## 2023-05-31 DIAGNOSIS — Z1389 Encounter for screening for other disorder: Secondary | ICD-10-CM | POA: Diagnosis not present

## 2023-05-31 DIAGNOSIS — M1000 Idiopathic gout, unspecified site: Secondary | ICD-10-CM | POA: Diagnosis not present

## 2023-05-31 DIAGNOSIS — I700 Atherosclerosis of aorta: Secondary | ICD-10-CM

## 2023-05-31 DIAGNOSIS — Z111 Encounter for screening for respiratory tuberculosis: Secondary | ICD-10-CM

## 2023-05-31 DIAGNOSIS — Z130 Encounter for screening for diseases of the blood and blood-forming organs and certain disorders involving the immune mechanism: Secondary | ICD-10-CM

## 2023-05-31 DIAGNOSIS — R5383 Other fatigue: Secondary | ICD-10-CM

## 2023-05-31 DIAGNOSIS — Z79899 Other long term (current) drug therapy: Secondary | ICD-10-CM

## 2023-05-31 DIAGNOSIS — Z136 Encounter for screening for cardiovascular disorders: Secondary | ICD-10-CM | POA: Diagnosis not present

## 2023-05-31 DIAGNOSIS — Z1329 Encounter for screening for other suspected endocrine disorder: Secondary | ICD-10-CM | POA: Diagnosis not present

## 2023-05-31 DIAGNOSIS — Z1211 Encounter for screening for malignant neoplasm of colon: Secondary | ICD-10-CM

## 2023-05-31 DIAGNOSIS — Z125 Encounter for screening for malignant neoplasm of prostate: Secondary | ICD-10-CM | POA: Diagnosis not present

## 2023-05-31 DIAGNOSIS — E349 Endocrine disorder, unspecified: Secondary | ICD-10-CM

## 2023-05-31 DIAGNOSIS — K219 Gastro-esophageal reflux disease without esophagitis: Secondary | ICD-10-CM

## 2023-05-31 DIAGNOSIS — G478 Other sleep disorders: Secondary | ICD-10-CM

## 2023-05-31 DIAGNOSIS — I10 Essential (primary) hypertension: Secondary | ICD-10-CM

## 2023-05-31 DIAGNOSIS — Z13220 Encounter for screening for lipoid disorders: Secondary | ICD-10-CM

## 2023-05-31 DIAGNOSIS — N401 Enlarged prostate with lower urinary tract symptoms: Secondary | ICD-10-CM

## 2023-05-31 DIAGNOSIS — E782 Mixed hyperlipidemia: Secondary | ICD-10-CM

## 2023-05-31 DIAGNOSIS — Z131 Encounter for screening for diabetes mellitus: Secondary | ICD-10-CM

## 2023-05-31 DIAGNOSIS — R350 Frequency of micturition: Secondary | ICD-10-CM

## 2023-05-31 DIAGNOSIS — E559 Vitamin D deficiency, unspecified: Secondary | ICD-10-CM

## 2023-05-31 DIAGNOSIS — E662 Morbid (severe) obesity with alveolar hypoventilation: Secondary | ICD-10-CM

## 2023-05-31 DIAGNOSIS — G4733 Obstructive sleep apnea (adult) (pediatric): Secondary | ICD-10-CM

## 2023-05-31 DIAGNOSIS — Z0001 Encounter for general adult medical examination with abnormal findings: Secondary | ICD-10-CM

## 2023-05-31 LAB — COMPLETE METABOLIC PANEL WITH GFR
ALT: 30 U/L (ref 9–46)
AST: 23 U/L (ref 10–35)
Calcium: 9.2 mg/dL (ref 8.6–10.3)
Potassium: 4.2 mmol/L (ref 3.5–5.3)
Total Bilirubin: 0.5 mg/dL (ref 0.2–1.2)
Total Protein: 7 g/dL (ref 6.1–8.1)

## 2023-05-31 LAB — IRON, TOTAL/TOTAL IRON BINDING CAP: TIBC: 310 mcg/dL (calc) (ref 250–425)

## 2023-05-31 LAB — LIPID PANEL: Cholesterol: 147 mg/dL (ref <200)

## 2023-05-31 MED ORDER — PHENTERMINE HCL 37.5 MG PO TABS: ORAL_TABLET | ORAL | 1 refills | Status: DC

## 2023-05-31 MED ORDER — TOPIRAMATE 50 MG PO TABS: ORAL_TABLET | ORAL | 1 refills | Status: DC

## 2023-06-01 LAB — CBC WITH DIFFERENTIAL/PLATELET
Absolute Monocytes: 539 cells/uL (ref 200–950)
Basophils Absolute: 37 cells/uL (ref 0–200)
Basophils Relative: 0.4 %
Eosinophils Absolute: 270 cells/uL (ref 15–500)
Eosinophils Relative: 2.9 %
HCT: 39.7 % (ref 38.5–50.0)
Hemoglobin: 13.2 g/dL (ref 13.2–17.1)
Lymphs Abs: 2037 cells/uL (ref 850–3900)
MCH: 28.9 pg (ref 27.0–33.0)
MCHC: 33.2 g/dL (ref 32.0–36.0)
MCV: 86.9 fL (ref 80.0–100.0)
MPV: 11.1 fL (ref 7.5–12.5)
Monocytes Relative: 5.8 %
Neutro Abs: 6417 cells/uL (ref 1500–7800)
Neutrophils Relative %: 69 %
Platelets: 296 10*3/uL (ref 140–400)
RBC: 4.57 10*6/uL (ref 4.20–5.80)
RDW: 13.2 % (ref 11.0–15.0)
Total Lymphocyte: 21.9 %
WBC: 9.3 10*3/uL (ref 3.8–10.8)

## 2023-06-01 LAB — COMPLETE METABOLIC PANEL WITH GFR
AG Ratio: 1.7 (calc) (ref 1.0–2.5)
Albumin: 4.4 g/dL (ref 3.6–5.1)
Alkaline phosphatase (APISO): 102 U/L (ref 35–144)
BUN: 17 mg/dL (ref 7–25)
CO2: 28 mmol/L (ref 20–32)
Chloride: 103 mmol/L (ref 98–110)
Creat: 0.9 mg/dL (ref 0.70–1.35)
Globulin: 2.6 g/dL (calc) (ref 1.9–3.7)
Glucose, Bld: 94 mg/dL (ref 65–99)
Sodium: 140 mmol/L (ref 135–146)
eGFR: 96 mL/min/{1.73_m2} (ref >=60)

## 2023-06-01 LAB — MICROALBUMIN / CREATININE URINE RATIO
Creatinine, Urine: 96 mg/dL (ref 20–320)
Microalb, Ur: <0.2 mg/dL

## 2023-06-01 LAB — VITAMIN B12: Vitamin B-12: 473 pg/mL (ref 200–1100)

## 2023-06-01 LAB — URINALYSIS, ROUTINE W REFLEX MICROSCOPIC
Bilirubin Urine: NEGATIVE
Glucose, UA: NEGATIVE
Hgb urine dipstick: NEGATIVE
Ketones, ur: NEGATIVE
Leukocytes,Ua: NEGATIVE
Nitrite: NEGATIVE
Protein, ur: NEGATIVE
Specific Gravity, Urine: 1.015 (ref 1.001–1.035)
pH: 5.5 (ref 5.0–8.0)

## 2023-06-01 LAB — LIPID PANEL
HDL: 46 mg/dL (ref >=40)
LDL Cholesterol (Calc): 70 mg/dL (calc)
Non-HDL Cholesterol (Calc): 101 mg/dL (calc) (ref <130)
Total CHOL/HDL Ratio: 3.2 (calc) (ref <5.0)
Triglycerides: 225 mg/dL — ABNORMAL HIGH (ref <150)

## 2023-06-01 LAB — INSULIN, RANDOM: Insulin: 19.5 u[IU]/mL — ABNORMAL HIGH

## 2023-06-01 LAB — HEMOGLOBIN A1C
Hgb A1c MFr Bld: 5.7 % of total Hgb — ABNORMAL HIGH (ref <5.7)
Mean Plasma Glucose: 117 mg/dL
eAG (mmol/L): 6.5 mmol/L

## 2023-06-01 LAB — TSH: TSH: 1.92 mIU/L (ref 0.40–4.50)

## 2023-06-01 LAB — MAGNESIUM: Magnesium: 2.2 mg/dL (ref 1.5–2.5)

## 2023-06-01 LAB — IRON, TOTAL/TOTAL IRON BINDING CAP
%SAT: 16 % (calc) — ABNORMAL LOW (ref 20–48)
Iron: 49 ug/dL — ABNORMAL LOW (ref 50–180)

## 2023-06-01 LAB — TESTOSTERONE: Testosterone: 104 ng/dL — ABNORMAL LOW (ref 250–827)

## 2023-06-01 LAB — URIC ACID: Uric Acid, Serum: 7.8 mg/dL (ref 4.0–8.0)

## 2023-06-01 LAB — VITAMIN D 25 HYDROXY (VIT D DEFICIENCY, FRACTURES): Vit D, 25-Hydroxy: 63 ng/mL (ref 30–100)

## 2023-06-01 LAB — PSA: PSA: 1.44 ng/mL (ref <=4.00)

## 2023-06-01 NOTE — Progress Notes (Signed)
^<^<^<^<^<^<^<^<^<^<^<^<^<^<^<^<^<^<^<^<^<^<^<^<^<^<^<^<^<^<^<^<^<^<^<^<^  ^>^>^>^>^>^>^>^>^>^>^>>^>^>^>^>^>^>^>^>^>^>^>^>^>^>^>^>^>^>^>^>^>^>^>^>^>  -Test results slightly outside the reference range are not unusual.  If there is anything important, I will review this with you,  otherwise it is considered normal test values.  If you have further questions,  please do not hesitate to contact me at the office or via My Chart.   ^<^<^<^<^<^<^<^<^<^<^<^<^<^<^<^<^<^<^<^<^<^<^<^<^<^<^<^<^<^<^<^<^<^<^<^<^  ^>^>^>^>^>^>^>^>^>^>^>^>^>^>^>^>^>^>^>^>^>^>^>^>^>^>^>^>^>^>^>^>^>^>^>^>^   - testosterone still low   ^<^<^<^<^<^<^<^<^<^<^<^<^<^<^<^<^<^<^<^<^<^<^<^<^<^<^<^<^<^<^<^<^<^<^<^<^  ^>^>^>^>^>^>^>^>^>^>^>^>^>^>^>^>^>^>^>^>^>^>^>^>^>^>^>^>^>^>^>^>^>^>^>^>^    9 Ways to Naturally Increase Testosterone Levels  =================================================================   1.  Lose Weight   If you're overweight, shedding the excess pounds may increase your testosterone levels, according to research presented at the Endocrine Society's 2012 meeting. Overweight men are more likely to have low testosterone levels to begin with, so this is an important trick to increase your body's testosterone production when you need it most.   2.   High-Intensity Exercise like Peak Fitness   Short intense exercise has a proven positive effect on increasing testosterone levels and preventing its decline. That's unlike aerobics or prolonged moderate exercise, which have shown to have negative or no effect on testosterone levels.  Having a whey protein meal after exercise can further enhance the satiety/testosterone-boosting impact (hunger hormones cause the opposite effect on your testosterone and libido). Here's a summary of what a typical high-intensity [employer] Fitness routine might look like:  " Warm up for three minutes  " Exercise as hard and fast as you can for 30 seconds. You should feel like you couldn't  possibly go on another few seconds  " Recover at a slow to moderate pace for 90 seconds  " Repeat the high intensity exercise and recovery 7 more times .   3.   Consume Plenty of Zinc   The mineral zinc is important for testosterone production, and supplementing your diet for as little as six weeks has been shown to cause a marked improvement in testosterone among men with low levels.1 Likewise, research has shown that restricting dietary sources of zinc leads to a significant decrease in testosterone, while zinc supplementation increases it - and even protects men from exercised-induced reductions in testosterone levels.   It's estimated that up to 45 percent of adults over the age of 60 may have lower than recommended zinc intakes; even when dietary supplements were added in, an estimated 20-25 percent of older adults still had inadequate zinc intakes, according to a [employer] and Nutrition Examination Survey.4  Your diet is the best source of zinc; along with protein-rich foods like  fish, other good dietary sources of zinc include raw milk, raw cheese, beans, and yogurt or kefir made from raw milk. It can be difficult to obtain enough dietary zinc if you're a vegetarian, and also for meat-eaters as well, largely because of conventional farming methods that rely heavily on chemical fertilizers and pesticides. These chemicals deplete the soil of nutrients ... nutrients like zinc that must be absorbed by plants in order to be passed on to you.  In many cases, you may further deplete the nutrients in your food by the way you prepare it. For most food, cooking it will drastically reduce its levels of nutrients like zinc ... particularly over-cooking, which many people do.  If you decide to use a zinc supplement, stick to a dosage of 50 mg a day, as this is the recommended adult dose. Taking too much zinc can interfere with your body's ability to absorb other minerals, especially copper, and may cause  nausea as a side effect.  4.   Strength Training   In addition to [employer]itness, strength training is also known to boost testosterone levels, provided you are doing so intensely enough. When strength training to boost testosterone, you'll want to increase the weight and lower your number of reps, and then focus on exercises that work a large number of muscles, such as dead lifts or squats.  You can "turbo-charge" your weight training by going slower. By slowing down your movement, you're actually turning it into a high-intensity exercise. Super Slow movement allows your muscle, at the microscopic level, to access the maximum number of cross-bridges between the protein filaments that produce movement in the muscle.   5.   Optimize Your Vitamin D Levels   Vitamin D, a steroid hormone, is essential for the healthy development of the nucleus of the sperm cell, and helps maintain semen quality and sperm count. Vitamin D also increases levels of testosterone, which may boost libido. In one study, overweight men who were given vitamin D supplements had a significant increase in testosterone levels after one year.5   6.   Reduce Stress   When you're under a lot of stress, your body releases high levels of the stress hormone cortisol. This hormone actually blocks the effects of testosterone,6 presumably because, from a biological standpoint, testosterone-associated behaviors (mating, competing, aggression) may have lowered your chances of survival in an emergency (hence, the "fight or flight" response is dominant, courtesy of cortisol).   7.   Limit or Eliminate Sugar from Your Diet   Testosterone levels decrease after you eat sugar, which is likely because the sugar leads to a high insulin level, another factor leading to low testosterone.7  Based on USDA estimates, the average American consumes 12 teaspoons of sugar a day, which equates to about TWO TONS of sugar during a lifetime.   8.   Eat Healthy  Fats   By healthy, this means not only mono- and polyunsaturated fats, like that found in avocadoes and nuts, but also saturated, as these are essential for building testosterone. Research shows that a diet with less than 40 percent of energy as fat (and that mainly from animal sources, i.e. saturated) lead to a decrease in testosterone levels. ie eat less animal products - as Meat , poultry and dairy. Experts agree that the ideal diet includes somewhere between 50-70 percent fat.  It's important to understand that your body requires saturated fats from animal and vegetable sources (such as meat, dairy, certain oils, and tropical plants like coconut) for optimal functioning, and if you neglect this important food group in favor of sugar, grains and other starchy carbs, your health and weight are almost guaranteed to suffer. Examples of healthy fats you can eat more of to give your testosterone levels a boost include:  Olives and Olive oil Coconuts and coconut oil Butter made from raw grass-fed organic milk  Raw nuts, such as, almonds or pecans Organic pastured egg yolks Avocados  Grass-fed meats Palm oil Unheated organic nut oils   9.   Boost Your Intake of Branch Chain Amino Acids (BCAA) from Foods Like Whey Protein  Research suggests that BCAAs result in higher testosterone levels, particularly when taken along with resistance training. While BCAAs are available in supplement form, you'll find the highest concentrations of BCAAs like leucine in whey protein.  Even when getting leucine from your natural food supply, it's often wasted or used as a building block instead of an anabolic agent. So to create the  correct anabolic environment, you need to boost leucine consumption way beyond mere maintenance levels.  That said, keep in mind that using leucine as a free form amino acid can be highly counterproductive as when free form amino acids are artificially administrated, they rapidly enter your  circulation while disrupting insulin function, and impairing your body's glycemic control. Food-based leucine is really the ideal form that can benefit your muscles without side effects.   ^<^<^<^<^<^<^<^<^<^<^<^<^<^<^<^<^<^<^<^<^<^<^<^<^<^<^<^<^<^<^<^<^<^<^<^<^  ^>^>^>^>^>^>^>^>^>^>^>^>^>^>^>^>^>^>^>^>^>^>^>^>^>^>^>^>^>^>^>^>^>^>^>^>^   - Chol = 147  and LDL = 70   - both  Excellent   - Very low risk for Heart Attack  / Stroke   ^>^>^>^>^>^>^>^>^>^>^>^>^>^>^>^>^>^>^>^>^>^>^>^>^>^>^>^>^>^>^>^>^>^>^>^>^  ^>^>^>^>^>^>^>^>^>^>^>^>^>^>^>^>^>^>^>^>^>^>^>^>^>^>^>^>^>^>^>^>^>^>^>^>^   - but Triglycerides (  = 225    ) or fats in blood are too high                 (   Ideal or  Goal is less than 150  !  )    - Recommend avoid fried & greasy foods,  sweets / candy,   - Avoid white rice  (brown or wild rice or Quinoa is OK),   - Avoid white potatoes  (sweet potatoes are OK)   - Avoid anything made from white flour  - bagels, doughnuts, rolls, buns, biscuits, white and   wheat breads, pizza crust and traditional  pasta made of white flour & egg white   - (vegetarian pasta or spinach or wheat pasta is OK).    - Multi-grain bread is OK - like multi-grain flat bread or   sandwich thins.   - Avoid alcohol in excess.   - Exercise is also important.   ^<^<^<^<^<^<^<^<^<^<^<^<^<^<^<^<^<^<^<^<^<^<^<^<^<^<^<^<^<^<^<^<^<^<^<^<^  ^>^>^>^>^>^>^>^>^>^>^>^>^>^>^>^>^>^>^>^>^>^>^>^>^>^>^>^>^>^>^>^>^>^>^>^>^   - A1c better  - down from 6.)% to now 5.7%                                                   - almost down to normal non Diabetic range  !  Great !   ^<^<^<^<^<^<^<^<^<^<^<^<^<^<^<^<^<^<^<^<^<^<^<^<^<^<^<^<^<^<^<^<^<^<^<^<^  ^>^>^>^>^>^>^>^>^>^>^>^>^>^>^>^>^>^>^>^>^>^>^>^>^>^>^>^>^>^>^>^>^>^>^>^>^   - Vitamin  B12 = 473    -  Vitamin B12 =  473  is   Very Low  (Ideal or Goal Vit B12 is between 450 - 1,100)   Low Vit B12 may be associated with Anemia , Fatigue,   Peripheral  Neuropathy, Dementia, "Brain Fog", & Depression   - Recommend take a sub-lingual form of Vitamin B12 tablet   1,000 to 5,000 mcg tab that you dissolve under your tongue /Daily   - Can get Lavonia Dana - best price at [employer] or on [employer]   ^<^<^<^<^<^<^<^<^<^<^<^<^<^<^<^<^<^<^<^<^<^<^<^<^<^<^<^<^<^<^<^<^<^<^<^<^  ^>^>^>^>^>^>^>^>^>^>^>^>^>^>^>^>^>^>^>^>^>^>^>^>^>^>^>^>^>^>^>^>^>^>^>^>^   - PSA very low - Great !   ^<^<^<^<^<^<^<^<^<^<^<^<^<^<^<^<^<^<^<^<^<^<^<^<^<^<^<^<^<^<^<^<^<^<^<^<^  ^>^>^>^>^>^>^>^>^>^>^>^>^>^>^>^>^>^>^>^>^>^>^>^>^>^>^>^>^>^>^>^>^>^>^>^>^   - Vitamin D = 63  - Excellent !     - Please keep dose same   ^<^<^<^<^<^<^<^<^<^<^<^<^<^<^<^<^<^<^<^<^<^<^<^<^<^<^<^<^<^<^<^<^<^<^<^<^  ^>^>^>^>^>^>^>^>^>^>^>^>^>^>^>^>^>^>^>^>^>^>^>^>^>^>^>^>^>^>^>^>^>^>^>^>^   - Uric acid / Gout test = 7.8  is high Normal   -  So suggest that you be sure to avoid eating a lot of animal products  ( meat )   - Avoid Alcohol   -And if develop symptoms of Gout  ( joint pains ) , then will need to restart Gout  meds   ^<^<^<^<^<^<^<^<^<^<^<^<^<^<^<^<^<^<^<^<^<^<^<^<^<^<^<^<^<^<^<^<^<^<^<^<^  ^>^>^>^>^>^>^>^>^>^>^>^>^>^>^>^>^>^>^>^>^>^>^>^>^>^>^>^>^>^>^>^>^>^>^>^>^   All Else - CBC - Kidneys - Electrolytes - Liver - Magnesium & Thyroid    - all  Normal / OK   ^<^<^<^<^<^<^<^<^<^<^<^<^<^<^<^<^<^<^<^<^<^<^<^<^<^<^<^<^<^<^<^<^<^<^<^<^  ^>^>^>^>^>^>^>^>^>^>^>^>^>^>^>^>^>^>^>^>^>^>^>^>^>^>^>^>^>^>^>^>^>^>^>^>^   -

## 2023-06-02 ENCOUNTER — Ambulatory Visit: Payer: Managed Care, Other (non HMO) | Admitting: Podiatry

## 2023-06-24 ENCOUNTER — Ambulatory Visit (INDEPENDENT_AMBULATORY_CARE_PROVIDER_SITE_OTHER): Payer: Managed Care, Other (non HMO) | Admitting: Nurse Practitioner

## 2023-06-24 ENCOUNTER — Encounter: Payer: Self-pay | Admitting: Nurse Practitioner

## 2023-06-24 VITALS — BP 112/70 | HR 85 | Temp 97.5°F | Ht 75.0 in | Wt 289.0 lb

## 2023-06-24 DIAGNOSIS — E662 Morbid (severe) obesity with alveolar hypoventilation: Secondary | ICD-10-CM | POA: Diagnosis not present

## 2023-06-24 DIAGNOSIS — I10 Essential (primary) hypertension: Secondary | ICD-10-CM

## 2023-06-24 DIAGNOSIS — Z6837 Body mass index (BMI) 37.0-37.9, adult: Secondary | ICD-10-CM

## 2023-06-24 NOTE — Patient Instructions (Signed)

## 2023-06-24 NOTE — Progress Notes (Signed)
Assessment and Plan:  Blake "Mardelle Matte" was seen today for dizziness.  Diagnoses and all orders for this visit:  Essential hypertension - continue medications, DASH diet, exercise and monitor at home. Call if greater than 130/80.    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 Discussed med's effects and SE's.   Over 30 minutes of exam, counseling, chart review, and critical decision making was performed.   Future Appointments  Date Time Provider Department Center  08/31/2023 11:00 AM Adela Glimpse, NP [facility] None  11/30/2023 11:00 AM Lucky Cowboy, MD [facility] None  02/28/2024 11:30 AM Adela Glimpse, NP [facility] None  06/08/2024  3:00 PM Lucky Cowboy, MD [facility] None    ------------------------------------------------------------------------------------------------------------------   HPI BP 112/70   Pulse 85   Temp (!) 97.5 F (36.4 C)   Ht 6\' 3"  (1.905 m)   Wt 289 lb (131.1 kg)   SpO2 96%   BMI 36.12 kg/m  62 y.o.male presents for dizziness and more frequent urination. He has also been noticing dry mouth. He is watching his diet and decreasing simple carbs and saturated fats.   He was started on Phentermine 37.5 mg every day and Topiramate 50 mg BID on 05/31/23 for weight loss .  He has been noticing some dizziness, feels fuzzy headed. BMI is Body mass index is 36.12 kg/m., he has been working on diet and exercise. Wt Readings from Last 3 Encounters:  06/24/23 289 lb (131.1 kg)  05/31/23 299 lb (135.6 kg)  12/02/22 290 lb 12.8 oz (131.9 kg)   He has been doing a home sleep test the past 3 nights.  BP currently controlled with Ziac 10/6.25 mg every day and olmesartan 40 mg  1/2 every day. BP Readings from Last 3 Encounters:  06/24/23 112/70  05/31/23 136/76  12/02/22 118/78    He is currently on Rosuvastatin 10 mg every day for hyperlipidemia and denies myalgias.  Lipids are currently at goal Lab Results  Component Value Date   CHOL 147 05/31/2023   HDL 46 05/31/2023   LDLCALC 70 05/31/2023   TRIG 225 (H) 05/31/2023   CHOLHDL 3.2 05/31/2023      Past Medical History:  Diagnosis Date   Diverticulosis of colon    mild  02/06/2011   GERD (gastroesophageal reflux disease)    Hypertension    Varicose veins      No Known Allergies  Current Outpatient Medications on File Prior to Visit  Medication Sig   bisoprolol-hydrochlorothiazide (ZIAC) 10-6.25 MG tablet Take 1 tablet every Night for BP                                                         /  TAKE                                         BY                                                 MOUTH   Cholecalciferol (VITAMIN D PO) Take 5,000 Units by mouth 2 (two) times daily.   Multiple Vitamin (MULTI-VITAMINS) TABS Take 1 tablet by mouth daily.   olmesartan (BENICAR) 40 MG tablet Take  1/2 to 1 tablet  every Morning  for BP   phentermine (ADIPEX-P) 37.5 MG tablet Take 1/2 to 1 tablet every Morning for Dieting & Weight Loss   rosuvastatin (CRESTOR) 10 MG tablet Take 1 tablet (10 mg total) by mouth daily.   topiramate (TOPAMAX) 50 MG tablet Take 1/2 to 1 tablet 2 x /day at Suppertime & Bedtime for Dieting & Weight Loss   Zinc Chelated 50 MG TABS Take 50 mg by mouth daily.   No current facility-administered medications on file prior to visit.    ROS: all negative except above.   Physical Exam:  BP 112/70   Pulse 85   Temp (!) 97.5 F (36.4 C)   Ht 6\' 3"  (1.905 m)   Wt 289 lb (131.1 kg)   SpO2 96%   BMI 36.12 kg/m   General Appearance: Well nourished, in no apparent distress. Eyes: PERRLA, EOMs, conjunctiva no swelling or  erythema Sinuses: No Frontal/maxillary tenderness Neck: Supple, thyroid normal.  Respiratory: Respiratory effort normal, BS equal bilaterally without rales, rhonchi, wheezing or stridor.  Cardio: RRR with no MRGs. Brisk peripheral pulses without edema.  Musculoskeletal: Full ROM, 5/5 strength, normal gait.  Skin: Warm, dry without rashes, lesions, ecchymosis.  Neuro: Cranial nerves intact. Normal muscle tone, no cerebellar symptoms. Sensation intact.  Psych: Awake and oriented X 3, normal affect, Insight and Judgment appropriate.     Raynelle Dick, NP 3:16 PM [facility]

## 2023-07-12 ENCOUNTER — Encounter: Payer: Self-pay | Admitting: Nurse Practitioner

## 2023-07-15 ENCOUNTER — Encounter: Payer: Self-pay | Admitting: Nurse Practitioner

## 2023-07-20 ENCOUNTER — Other Ambulatory Visit: Payer: Self-pay | Admitting: Nurse Practitioner

## 2023-07-21 ENCOUNTER — Encounter: Payer: Self-pay | Admitting: Internal Medicine

## 2023-07-29 ENCOUNTER — Other Ambulatory Visit: Payer: Self-pay

## 2023-07-29 DIAGNOSIS — K219 Gastro-esophageal reflux disease without esophagitis: Secondary | ICD-10-CM

## 2023-07-29 DIAGNOSIS — Z1212 Encounter for screening for malignant neoplasm of rectum: Secondary | ICD-10-CM

## 2023-07-29 DIAGNOSIS — Z1211 Encounter for screening for malignant neoplasm of colon: Secondary | ICD-10-CM

## 2023-07-29 LAB — POC HEMOCCULT BLD/STL (HOME/3-CARD/SCREEN)
Card #2 Fecal Occult Blod, POC: NEGATIVE
Card #3 Fecal Occult Blood, POC: NEGATIVE
Fecal Occult Blood, POC: NEGATIVE

## 2023-08-25 ENCOUNTER — Ambulatory Visit (INDEPENDENT_AMBULATORY_CARE_PROVIDER_SITE_OTHER): Payer: Managed Care, Other (non HMO) | Admitting: Nurse Practitioner

## 2023-08-25 ENCOUNTER — Encounter: Payer: Self-pay | Admitting: Nurse Practitioner

## 2023-08-25 VITALS — BP 124/86 | HR 82 | Ht 74.0 in | Wt 284.2 lb

## 2023-08-25 DIAGNOSIS — Z6836 Body mass index (BMI) 36.0-36.9, adult: Secondary | ICD-10-CM | POA: Diagnosis not present

## 2023-08-25 DIAGNOSIS — E6609 Other obesity due to excess calories: Secondary | ICD-10-CM

## 2023-08-25 DIAGNOSIS — E669 Obesity, unspecified: Secondary | ICD-10-CM | POA: Insufficient documentation

## 2023-08-25 DIAGNOSIS — G4733 Obstructive sleep apnea (adult) (pediatric): Secondary | ICD-10-CM | POA: Diagnosis not present

## 2023-08-25 NOTE — Assessment & Plan Note (Signed)
BMI 36. Healthy weight loss encouraged.

## 2023-08-25 NOTE — Progress Notes (Signed)
@Patient  ID: Blake Johnson, male    DOB: 13-Aug-1961, 62 y.o.   MRN: [id number]  Chief Complaint  Patient presents with   Consult    Pt has a sleep study before ( 2 months )     Referring provider: Lucky Cowboy, MD  HPI: 62 year old male, never smoker referred for sleep consult.  Past medical history significant for GERD, HTN, HLD, obesity.  TEST/EVENTS:  06/22/2023 HST: AHI 9.5, SpO2 low 85%; weight 299 pounds  08/25/2023: Today-sleep consult Patient presents today for sleep consult, referred by Dr. Oneta Rack.  He had a home sleep study completed due to his wife being concerned about his snoring.  She had also said that she had noticed that he stopped breathing from time to time when he was sleeping.  He tells me that he does not have any significant daytime sleepiness.  He does sometimes get tired after working all day and can feel a little drowsy when he gets in the car to drive home but never fallen asleep while driving.  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  He denies any morning headaches, sleep parasomnia/paralysis. Goes to bed between 11 to 11:30 PM.  Falls asleep within 20 minutes.  Wakes 2-3 times a night to use the restroom.  Officially gets up around 6:15 AM.  No sleep aids.  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  His weight had gone up 20 pounds but he is now actively working on weight loss and is down about 15 pounds since his sleep study in July.  He had a HST on 06/22/2023 that showed mild sleep apnea with AHI 9.5.  He has never been on CPAP before.  Not on any oxygen. He has HTN, controlled with antihypertensives. No history of diabetes or stroke.  He is a never smoker.  Drinks 2 mixed drinks a week. He drinks 1 cup of coffee. Lives with his wife and 2 cats. Works as an Chief Financial Officer. Family history of colon cancer.   Epworth 3  No Known Allergies  Immunization History  Administered Date(s) Administered   Influenza Inj Mdck Quad Pf  10/11/2022   Influenza, Quadrivalent, Recombinant, Inj, Pf 10/28/2019   Influenza,inj,Quad PF,6+ Mos 10/01/2018, 11/23/2020   Influenza-Unspecified 09/26/2016, 10/14/2017, 10/28/2019   PFIZER(Purple Top)SARS-COV-2 Vaccination 02/29/2020, 03/21/2020, 10/26/2020   PPD Test 12/31/2015, 01/05/2017, 02/03/2018, 04/12/2019, 05/22/2020, 05/27/2021, 05/26/2022, 05/31/2023   Tdap 12/31/2015, 09/17/2021   Unspecified SARS-COV-2 Vaccination 10/11/2022    Past Medical History:  Diagnosis Date   Diverticulosis of colon    mild  02/06/2011   GERD (gastroesophageal reflux disease)    Hypertension    Varicose veins     Tobacco History: Social History   Tobacco Use  Smoking Status Never  Smokeless Tobacco Never  Tobacco Comments   10 yrs ago   Counseling given: Not Answered Tobacco comments: 10 yrs ago   Outpatient Medications Prior to Visit  Medication Sig Dispense Refill   bisoprolol-hydrochlorothiazide (ZIAC) 10-6.25 MG tablet Take 1 tablet every Night for BP                                                         /  TAKE                                         BY                                                 MOUTH 90 tablet 3   Cholecalciferol (VITAMIN D PO) Take 5,000 Units by mouth 2 (two) times daily.     Multiple Vitamin (MULTI-VITAMINS) TABS Take 1 tablet by mouth daily.     olmesartan (BENICAR) 40 MG tablet Take  1/2 to 1 tablet  every Morning  for BP 90 tablet 3   omeprazole (PRILOSEC OTC) 20 MG tablet Take by mouth.     phentermine (ADIPEX-P) 37.5 MG tablet Take 1/2 to 1 tablet every Morning for Dieting & Weight Loss 90 tablet 1   rosuvastatin (CRESTOR) 10 MG tablet TAKE 1 TABLET BY MOUTH EVERY DAY 90 tablet 0   topiramate (TOPAMAX) 50 MG tablet Take 1/2 to 1 tablet 2 x /day at Suppertime & Bedtime for Dieting & Weight Loss 180 tablet 1   Zinc Chelated 50 MG TABS Take 50 mg by mouth daily.     No  facility-administered medications prior to visit.     Review of Systems:   Constitutional: No weight loss or gain, night sweats, fevers, chills,  or lassitude. +occasional fatigue  HEENT: No headaches, difficulty swallowing, tooth/dental problems, or sore throat. No sneezing, itching, ear ache, nasal congestion, or post nasal drip CV:  No chest pain, orthopnea, PND, swelling in lower extremities, anasarca, dizziness, palpitations, syncope Resp: +snoring, witnessed apneas. No shortness of breath with exertion or at rest. No excess mucus or change in color of mucus. No productive or non-productive. No hemoptysis. No wheezing.  No chest wall deformity GI:  +occasional heartburn, indigestion. No abdominal pain, nausea, vomiting, diarrhea, change in bowel habits, loss of appetite, bloody stools.  GU: No dysuria, change in color of urine, urgency or frequency.   Skin: No rash, lesions, ulcerations MSK:  No joint pain. +occasional ankle swelling (baseline) Neuro: No dizziness or lightheadedness.  Psych: No depression or anxiety. Mood stable.     Physical Exam:  BP 124/86   Pulse 82   Ht 6\' 2"  (1.88 m)   Wt 284 lb 3.2 oz (128.9 kg)   SpO2 95%   BMI 36.49 kg/m   GEN: Pleasant, interactive, well-appearing; obese; in no acute distress. HEENT:  Normocephalic and atraumatic. PERRLA. Sclera white. Nasal turbinates pink, moist and patent bilaterally. No rhinorrhea present. Oropharynx pink and moist, without exudate or edema. No lesions, ulcerations, or postnasal drip. Mallampati II NECK:  Supple w/ fair ROM. No JVD present. Normal carotid impulses w/o bruits. Thyroid symmetrical with no goiter or nodules palpated. No lymphadenopathy.   CV: RRR, no m/r/g, no peripheral edema. Pulses intact, +2 bilaterally. No cyanosis, pallor or clubbing. PULMONARY:  Unlabored, regular breathing. Clear bilaterally A&P w/o wheezes/rales/rhonchi. No accessory muscle use.  GI: BS present and normoactive. Soft,  non-tender to palpation. No organomegaly or masses detected. MSK: No erythema, warmth or tenderness. Cap refil <2 sec all extrem. No deformities or joint swelling noted.  Neuro: A/Ox3. No focal deficits noted.   Skin: Warm, no lesions or rashe Psych: Normal affect and behavior.  Judgement and thought content appropriate.     Lab Results:  CBC    Component Value Date/Time   WBC 9.3 05/31/2023 1515   RBC 4.57 05/31/2023 1515   HGB 13.2 05/31/2023 1515   HCT 39.7 05/31/2023 1515   PLT 296 05/31/2023 1515   MCV 86.9 05/31/2023 1515   MCH 28.9 05/31/2023 1515   MCHC 33.2 05/31/2023 1515   RDW 13.2 05/31/2023 1515   LYMPHSABS 2,037 05/31/2023 1515   MONOABS 594 01/05/2017 0923   EOSABS 270 05/31/2023 1515   BASOSABS 37 05/31/2023 1515    BMET    Component Value Date/Time   NA 140 05/31/2023 1515   K 4.2 05/31/2023 1515   CL 103 05/31/2023 1515   CO2 28 05/31/2023 1515   GLUCOSE 94 05/31/2023 1515   BUN 17 05/31/2023 1515   CREATININE 0.90 05/31/2023 1515   CALCIUM 9.2 05/31/2023 1515   GFRNONAA 78 05/26/2021 1338   GFRAA 90 05/26/2021 1338    BNP No results found for: "BNP"   Imaging:  No results found.  Administration History     None           No data to display          No results found for: "NITRICOXIDE"      Assessment & Plan:   Mild obstructive sleep apnea Mild OSA with AHI 9.5/h. Relatively asymptomatic without significant daytime symptoms. He would like to continue working on weight loss and opted for positional sleeping. Does not wish to pursue oral appliance or CPAP at this time. Would not be a candidate for hypoglossal nerve stimulator implant given mild severity. Advised him to monitor for any worsening symptoms. Otherwise, see in a year to see how weight loss has gone and reassess symptoms. Aware of safe driving practices.   Patient Instructions  Your sleep study showed mild sleep apnea. We discussed how untreated sleep apnea puts an  individual at risk for cardiac arrhthymias, pulm HTN, DM, stroke and increases their risk for daytime accidents; although, these are less with mild sleep apnea. We also briefly reviewed treatment options including weight loss, side sleeping position, oral appliance, CPAP therapy or referral to ENT for possible surgical options. You have opted to move forward with positional sleeping and weight loss measures. If you notice that you are becoming more fatigued during the day or sleep is less restful, please call to schedule a sooner appointment and revisit alternative treatment options.  Follow up in one year or sooner, as needed    Class 2 obesity with body mass index (BMI) of 36.0 to 36.9 in adult BMI 36. Healthy weight loss encouraged.    I spent 35 minutes of dedicated to the care of this patient on the date of this encounter to include pre-visit review of records, face-to-face time with the patient discussing conditions above, post visit ordering of testing, clinical documentation with the electronic health record, making appropriate referrals as documented, and communicating necessary findings to members of the patients care team.  Noemi Chapel, NP 08/25/2023  Pt aware and understands NP's role.

## 2023-08-25 NOTE — Patient Instructions (Signed)
Your sleep study showed mild sleep apnea. We discussed how untreated sleep apnea puts an individual at risk for cardiac arrhthymias, pulm HTN, DM, stroke and increases their risk for daytime accidents; although, these are less with mild sleep apnea. We also briefly reviewed treatment options including weight loss, side sleeping position, oral appliance, CPAP therapy or referral to ENT for possible surgical options. You have opted to move forward with positional sleeping and weight loss measures. If you notice that you are becoming more fatigued during the day or sleep is less restful, please call to schedule a sooner appointment and revisit alternative treatment options.  Follow up in one year or sooner, as needed

## 2023-08-25 NOTE — Assessment & Plan Note (Signed)
Mild OSA with AHI 9.5/h. Relatively asymptomatic without significant daytime symptoms. He would like to continue working on weight loss and opted for positional sleeping. Does not wish to pursue oral appliance or CPAP at this time. Would not be a candidate for hypoglossal nerve stimulator implant given mild severity. Advised him to monitor for any worsening symptoms. Otherwise, see in a year to see how weight loss has gone and reassess symptoms. Aware of safe driving practices.   Patient Instructions  Your sleep study showed mild sleep apnea. We discussed how untreated sleep apnea puts an individual at risk for cardiac arrhthymias, pulm HTN, DM, stroke and increases their risk for daytime accidents; although, these are less with mild sleep apnea. We also briefly reviewed treatment options including weight loss, side sleeping position, oral appliance, CPAP therapy or referral to ENT for possible surgical options. You have opted to move forward with positional sleeping and weight loss measures. If you notice that you are becoming more fatigued during the day or sleep is less restful, please call to schedule a sooner appointment and revisit alternative treatment options.  Follow up in one year or sooner, as needed

## 2023-08-30 NOTE — Telephone Encounter (Signed)
He can call his insurance and see what their coverage is on an oral appliance for sleep apnea. If they do cover a portion of the device, we can send a referral to orthodontics and they can give him a cost estimate. If insurance doesn't cover it at all, they are very expensive. They usually take molds and measurements of the mouth and then the orthodontist creates the oral appliance.

## 2023-08-30 NOTE — Telephone Encounter (Signed)
Katie, I know you do not know what the cost is, but do you know what is involved in the fitting?

## 2023-08-31 ENCOUNTER — Ambulatory Visit (INDEPENDENT_AMBULATORY_CARE_PROVIDER_SITE_OTHER): Payer: Managed Care, Other (non HMO) | Admitting: Nurse Practitioner

## 2023-08-31 ENCOUNTER — Encounter: Payer: Self-pay | Admitting: Nurse Practitioner

## 2023-08-31 VITALS — BP 102/74 | HR 75 | Temp 97.3°F | Ht 75.0 in | Wt 280.8 lb

## 2023-08-31 DIAGNOSIS — M109 Gout, unspecified: Secondary | ICD-10-CM | POA: Diagnosis not present

## 2023-08-31 DIAGNOSIS — E559 Vitamin D deficiency, unspecified: Secondary | ICD-10-CM

## 2023-08-31 DIAGNOSIS — R7309 Other abnormal glucose: Secondary | ICD-10-CM | POA: Diagnosis not present

## 2023-08-31 DIAGNOSIS — K219 Gastro-esophageal reflux disease without esophagitis: Secondary | ICD-10-CM

## 2023-08-31 DIAGNOSIS — Z79899 Other long term (current) drug therapy: Secondary | ICD-10-CM

## 2023-08-31 DIAGNOSIS — I10 Essential (primary) hypertension: Secondary | ICD-10-CM

## 2023-08-31 DIAGNOSIS — E662 Morbid (severe) obesity with alveolar hypoventilation: Secondary | ICD-10-CM

## 2023-08-31 DIAGNOSIS — E782 Mixed hyperlipidemia: Secondary | ICD-10-CM

## 2023-08-31 DIAGNOSIS — Z6837 Body mass index (BMI) 37.0-37.9, adult: Secondary | ICD-10-CM

## 2023-08-31 NOTE — Progress Notes (Signed)
FOLLOW UP  Assessment and Plan:   Essential hypertension Discussed DASH (Dietary Approaches to Stop Hypertension) DASH diet is lower in sodium than a typical American diet. Cut back on foods that are high in saturated fat, cholesterol, and trans fats. Eat more whole-grain foods, fish, poultry, and nuts Remain active and exercise as tolerated daily.  Monitor BP at home-Call if greater than 130/80.  Check CMP/CBC  Hyperlipidemia, mixed Discussed lifestyle modifications. Recommended diet heavy in fruits and veggies, omega 3's. Decrease consumption of animal meats, cheeses, and dairy products. Remain active and exercise as tolerated. Continue to monitor. Check lipids/TSH  Gastroesophageal reflux disease without esophagitis No suspected reflux complications (Barret/stricture). Lifestyle modification:  wt loss, avoid meals 2-3h before bedtime. Consider eliminating food triggers:  chocolate, caffeine, EtOH, acid/spicy food.  Abnormal glucose Education: Reviewed 'ABCs' of diabetes management  Discussed goals to be met and/or maintained include A1C (<7) Blood pressure (<130/80) Cholesterol (LDL <70) Continue Eye Exam yearly  Continue Dental Exam Q6 mo Discussed dietary recommendations Discussed Physical Activity recommendations Check A1C  Vitamin D deficiency Continue supplement Monitor levels  Medication management All medications discussed and reviewed in full. All questions and concerns regarding medications addressed.    Class 2 severe obesity due to excess calories with serious comorbidity and body mass index (BMI) of 35.0 to 35.9 in adult [facility]) Discussed appropriate BMI Diet modification. Physical activity. Encouraged/praised to build confidence.  Left foot pain Check uric acid levels Discussed low purine diet Continue to monitor  Orders Placed This Encounter  Procedures   Uric acid   CBC with Differential/Platelet   COMPLETE METABOLIC PANEL WITH GFR   Lipid  panel   Hemoglobin A1c   Notify office for further evaluation and treatment, questions or concerns if any reported s/s fail to improve.   The patient was advised to call back or seek an in-person evaluation if any symptoms worsen or if the condition fails to improve as anticipated.   Further disposition pending results of labs. Discussed med's effects and SE's.    I discussed the assessment and treatment plan with the patient. The patient was provided an opportunity to ask questions and all were answered. The patient agreed with the plan and demonstrated an understanding of the instructions.  Discussed med's effects and SE's. Screening labs and tests as requested with regular follow-up as recommended.  I provided 25 minutes of face-to-face time during this encounter including counseling, chart review, and critical decision making was preformed.   Future Appointments  Date Time Provider Department Center  11/30/2023 11:00 AM Lucky Cowboy, MD [facility] None  02/28/2024 11:30 AM Adela Glimpse, NP [facility] None  06/08/2024  3:00 PM Lucky Cowboy, MD [facility] None    ----------------------------------------------------------------------------------------------------------------------  HPI 62 y.o. male  presents for a follow up on hypertension, cholesterol, diabetes, weight and vitamin D deficiency.   Overall he reports feeling well.    He has noticed increase in left elbow pain, left toe pain and left knee pain.  Potential gout flare however uric acid levels were WNL in 06/2023.  He does not feel as though he has increase purines.  He was provided a steroid taper in the past with improvement.    BMI is Body mass index is 35.1 kg/m., he has been working on diet and exercise. Wt Readings from Last 3 Encounters:  08/31/23 280 lb 12.8 oz (127.4 kg)  08/25/23 284 lb 3.2 oz (128.9 kg)  06/24/23 289 lb (131.1 kg)    His blood pressure  has been controlled at home, today their BP  is BP: 102/74  He does not workout. He denies chest pain, shortness of breath, dizziness.  He did notice is HR jumped to 120 x1 a few weeks ago but admits to not taking bisoprolol the  night before.  He has had no other episodes or events.    He is on cholesterol medication Rosuvastatin and denies myalgias. His cholesterol is not at goal with elevated triglycerides. The cholesterol last visit was:   Lab Results  Component Value Date   CHOL 147 05/31/2023   HDL 46 05/31/2023   LDLCALC 70 05/31/2023   TRIG 225 (H) 05/31/2023   CHOLHDL 3.2 05/31/2023    He has not been working on diet and exercise for prediabetes, and denies polydipsia and polyuria. Last A1C in the office was:  Lab Results  Component Value Date   HGBA1C 5.7 (H) 05/31/2023   Patient is on Vitamin D supplement.   Lab Results  Component Value Date   VD25OH 63 05/31/2023        Current Medications:  Current Outpatient Medications on File Prior to Visit  Medication Sig   acetaminophen (TYLENOL) 500 MG tablet Take 500 mg by mouth every 6 (six) hours as needed.   bisoprolol-hydrochlorothiazide (ZIAC) 10-6.25 MG tablet Take 1 tablet every Night for BP                                                         /                                                                   TAKE                                         BY                                                 MOUTH   Cholecalciferol (VITAMIN D PO) Take 5,000 Units by mouth 2 (two) times daily.   famotidine (PEPCID) 40 MG tablet Take 40 mg by mouth daily.   Multiple Vitamin (MULTI-VITAMINS) TABS Take 1 tablet by mouth daily.   olmesartan (BENICAR) 40 MG tablet Take  1/2 to 1 tablet  every Morning  for BP   phentermine (ADIPEX-P) 37.5 MG tablet Take 1/2 to 1 tablet every Morning for Dieting & Weight Loss   rosuvastatin (CRESTOR) 10 MG tablet TAKE 1 TABLET BY MOUTH EVERY DAY   Zinc Chelated 50 MG TABS Take 50 mg by mouth daily.   omeprazole (PRILOSEC OTC) 20 MG tablet  Take by mouth. (Patient not taking: Reported on 08/31/2023)   topiramate (TOPAMAX) 50 MG tablet Take 1/2 to 1 tablet 2 x /day at Suppertime & Bedtime for Dieting & Weight Loss (Patient not taking: Reported on 08/31/2023)   No  current facility-administered medications on file prior to visit.     Allergies: No Known Allergies   Medical History:  Past Medical History:  Diagnosis Date   Diverticulosis of colon    mild  02/06/2011   GERD (gastroesophageal reflux disease)    Hypertension    Varicose veins    Family history- Reviewed and unchanged Social history- Reviewed and unchanged   Review of Systems:  ROS    Physical Exam: BP 102/74   Pulse 75   Temp (!) 97.3 F (36.3 C)   Ht 6\' 3"  (1.905 m)   Wt 280 lb 12.8 oz (127.4 kg)   SpO2 97%   BMI 35.10 kg/m  Wt Readings from Last 3 Encounters:  08/31/23 280 lb 12.8 oz (127.4 kg)  08/25/23 284 lb 3.2 oz (128.9 kg)  06/24/23 289 lb (131.1 kg)   General Appearance: Well nourished, in no apparent distress. Eyes: PERRLA, EOMs, conjunctiva no swelling or erythema Sinuses: No Frontal/maxillary tenderness ENT/Mouth: Ext aud canals clear, TMs without erythema, bulging. No erythema, swelling, or exudate on post pharynx.  Tonsils not swollen or erythematous. Hearing normal.  Neck: Supple, thyroid normal.  Respiratory: Respiratory effort normal, BS equal bilaterally without rales, rhonchi, wheezing or stridor.  Cardio: RRR with no MRGs. Brisk peripheral pulses without edema.  Abdomen: Soft, + BS.  Non tender, no guarding, rebound, hernias, masses. Lymphatics: Non tender without lymphadenopathy.  Musculoskeletal: Full ROM, 5/5 strength, Normal gait Skin: Warm, dry without rashes, lesions, ecchymosis.  Neuro: Cranial nerves intact. No cerebellar symptoms.  Psych: Awake and oriented X 3, normal affect, Insight and Judgment appropriate.    Adela Glimpse, NP 11:39 AM [facility]

## 2023-08-31 NOTE — Patient Instructions (Signed)
Gout  Gout is painful swelling of your joints. Gout is a type of arthritis. It is caused by having too much uric acid in your body. Uric acid is a chemical that is made when your body breaks down substances called purines. If your body has too much uric acid, sharp crystals can form and build up in your joints. This causes pain and swelling. Gout attacks can happen quickly and be very painful (acute gout). Over time, the attacks can affect more joints and happen more often (chronic gout). What are the causes? Gout is caused by too much uric acid in your blood. This can happen because: Your kidneys do not remove enough uric acid from your blood. Your body makes too much uric acid. You eat too many foods that are high in purines. These foods include organ meats, some seafood, and beer. Trauma or stress can bring on an attack. What increases the risk? Having a family history of gout. Being male and middle-aged. Being male and having gone through menopause. Having an organ transplant. Taking certain medicines. Having certain conditions, such as: Being very overweight (obese). Lead poisoning. Kidney disease. A skin condition called psoriasis. Other risks include: Losing weight too quickly. Not having enough water in the body (being dehydrated). Drinking alcohol, especially beer. Drinking beverages that are sweetened with a type of sugar called fructose. What are the signs or symptoms? An attack of acute gout often starts at night and usually happens in just one joint. The most common place is the big toe. Other joints that may be affected include joints of the feet, ankle, knee, fingers, wrist, or elbow. Symptoms may include: Very bad pain. Warmth. Swelling. Stiffness. Tenderness. The affected joint may be very painful to touch. Shiny, red, or purple skin. Chills and fever. Chronic gout may cause symptoms more often. More joints may be involved. You may also have white or yellow lumps  (tophi) on your hands or feet or in other areas near your joints. How is this treated? Treatment for an acute attack may include medicines for pain and swelling, such as: NSAIDs, such as ibuprofen. Steroids taken by mouth or injected into a joint. Colchicine. This can be given by mouth or through an IV tube. Treatment to prevent future attacks may include: Taking small doses of NSAIDs or colchicine daily. Using a medicine that reduces uric acid levels in your blood, such as allopurinol. Making changes to your diet. You may need to see a food expert (dietitian) about what to eat and drink to prevent gout. Follow these instructions at home: During a gout attack  If told, put ice on the painful area. To do this: Put ice in a plastic bag. Place a towel between your skin and the bag. Leave the ice on for 20 minutes, 2-3 times a day. Take off the ice if your skin turns bright red. This is very important. If you cannot feel pain, heat, or cold, you have a greater risk of damage to the area. Raise the painful joint above the level of your heart as often as you can. Rest the joint as much as possible. If the joint is in your leg, you may be given crutches. Follow instructions from your doctor about what you cannot eat or drink. Avoiding future gout attacks Eat a low-purine diet. Avoid foods and drinks such as: Liver. Kidney. Anchovies. Asparagus. Herring. Mushrooms. Mussels. Beer. Stay at a healthy weight. If you want to lose weight, talk with your doctor. Do not  lose weight too fast. Start or continue an exercise plan as told by your doctor. Eating and drinking Avoid drinks sweetened by fructose. Drink enough fluids to keep your pee (urine) pale yellow. If you drink alcohol: Limit how much you have to: 0-1 drink a day for women who are not pregnant. 0-2 drinks a day for men. Know how much alcohol is in a drink. In the [place] one drink equals one 12 oz bottle of beer (355 mL), one 5 oz  glass of wine (148 mL), or one 1 oz glass of hard liquor (44 mL). General instructions Take over-the-counter and prescription medicines only as told by your doctor. Ask your doctor if you should avoid driving or using machines while you are taking your medicine. Return to your normal activities when your doctor says that it is safe. Keep all follow-up visits. Where to find more information [employer]: www.niams.http://www.myers.net/ Contact a doctor if: You have another gout attack. You still have symptoms of a gout attack after 10 days of treatment. You have problems (side effects) because of your medicines. You have chills or a fever. You have burning pain when you pee (urinate). You have pain in your lower back or belly. Get help right away if: You have very bad pain. Your pain cannot be controlled. You cannot pee. Summary Gout is painful swelling of the joints. The most common site of pain is the big toe, but it can affect other joints. Medicines and avoiding some foods can help to prevent and treat gout attacks. This information is not intended to replace advice given to you by your health care provider. Make sure you discuss any questions you have with your health care provider. Document Revised: 09/03/2021 Document Reviewed: 09/03/2021 Elsevier Patient Education  2024 [employer].

## 2023-09-01 LAB — CBC WITH DIFFERENTIAL/PLATELET
Absolute Monocytes: 637 {cells}/uL (ref 200–950)
Basophils Absolute: 46 {cells}/uL (ref 0–200)
Basophils Relative: 0.5 %
Eosinophils Absolute: 209 {cells}/uL (ref 15–500)
Eosinophils Relative: 2.3 %
HCT: 42.9 % (ref 38.5–50.0)
Hemoglobin: 14.1 g/dL (ref 13.2–17.1)
Lymphs Abs: 2211 {cells}/uL (ref 850–3900)
MCH: 29.2 pg (ref 27.0–33.0)
MCHC: 32.9 g/dL (ref 32.0–36.0)
MCV: 88.8 fL (ref 80.0–100.0)
MPV: 10.7 fL (ref 7.5–12.5)
Monocytes Relative: 7 %
Neutro Abs: 5997 {cells}/uL (ref 1500–7800)
Neutrophils Relative %: 65.9 %
Platelets: 346 10*3/uL (ref 140–400)
RBC: 4.83 Million/uL (ref 4.20–5.80)
RDW: 12.9 % (ref 11.0–15.0)
Total Lymphocyte: 24.3 %
WBC: 9.1 10*3/uL (ref 3.8–10.8)

## 2023-09-01 LAB — LIPID PANEL
Cholesterol: 146 mg/dL
HDL: 44 mg/dL
LDL Cholesterol (Calc): 69 mg/dL
Non-HDL Cholesterol (Calc): 102 mg/dL
Total CHOL/HDL Ratio: 3.3 (calc)
Triglycerides: 235 mg/dL — ABNORMAL HIGH

## 2023-09-01 LAB — HEMOGLOBIN A1C
Hgb A1c MFr Bld: 5.9 %{Hb} — ABNORMAL HIGH
Mean Plasma Glucose: 123 mg/dL
eAG (mmol/L): 6.8 mmol/L

## 2023-09-06 ENCOUNTER — Encounter: Payer: Self-pay | Admitting: Nurse Practitioner

## 2023-10-10 ENCOUNTER — Other Ambulatory Visit: Payer: Self-pay | Admitting: Nurse Practitioner

## 2023-10-13 ENCOUNTER — Other Ambulatory Visit: Payer: Self-pay | Admitting: Internal Medicine

## 2023-10-13 DIAGNOSIS — K219 Gastro-esophageal reflux disease without esophagitis: Secondary | ICD-10-CM

## 2023-11-05 ENCOUNTER — Encounter: Payer: Self-pay | Admitting: Nurse Practitioner

## 2023-11-05 NOTE — Telephone Encounter (Signed)
Please call pt to schedule an appointment for early next week

## 2023-11-08 NOTE — Progress Notes (Unsigned)
Assessment and Plan:  There are no diagnoses linked to this encounter.    Further disposition pending results of labs. Discussed med's effects and SE's.   Over 30 minutes of exam, counseling, chart review, and critical decision making was performed.   Future Appointments  Date Time Provider Department Center  11/09/2023  9:00 AM Raynelle Dick, NP [facility] None  11/30/2023 11:00 AM Lucky Cowboy, MD [facility] None  02/28/2024 11:30 AM Adela Glimpse, NP [facility] None  06/08/2024  3:00 PM Lucky Cowboy, MD [facility] None    ------------------------------------------------------------------------------------------------------------------   HPI There were no vitals taken for this visit. 62 y.o.male presents for  Past Medical History:  Diagnosis Date   Diverticulosis of colon    mild  02/06/2011   GERD (gastroesophageal reflux disease)    Hypertension    Varicose veins      No Known Allergies  Current Outpatient Medications on File Prior to Visit  Medication Sig   acetaminophen (TYLENOL) 500 MG tablet Take 500 mg by mouth every 6 (six) hours as needed.   bisoprolol-hydrochlorothiazide (ZIAC) 10-6.25 MG tablet Take 1 tablet every Night for BP                                                         /                                                                   TAKE                                         BY                                                 MOUTH   Cholecalciferol (VITAMIN D PO) Take 5,000 Units by mouth 2 (two) times daily.   famotidine (PEPCID) 40 MG tablet TAKE 1 TABLET AT BEDTIME FOR ACID REFLUX   Multiple Vitamin (MULTI-VITAMINS) TABS Take 1 tablet by mouth daily.   olmesartan (BENICAR) 40 MG tablet Take  1/2 to 1 tablet  every Morning  for BP   phentermine (ADIPEX-P) 37.5 MG tablet Take 1/2 to 1 tablet every Morning for Dieting & Weight Loss   rosuvastatin (CRESTOR) 10 MG tablet TAKE 1 TABLET BY MOUTH EVERY DAY   Zinc Chelated 50 MG TABS  Take 50 mg by mouth daily.   No current facility-administered medications on file prior to visit.    ROS: all negative except above.   Physical Exam:  There were no vitals taken for this visit.  General Appearance: Well nourished, in no apparent distress. Eyes: PERRLA, EOMs, conjunctiva no swelling or erythema Sinuses: No Frontal/maxillary tenderness ENT/Mouth: Ext aud canals clear, TMs without erythema, bulging. No erythema, swelling, or exudate on post pharynx.  Tonsils not swollen or erythematous. Hearing normal.  Neck: Supple, thyroid normal.  Respiratory: Respiratory effort normal, BS equal bilaterally without rales, rhonchi, wheezing or stridor.  Cardio: RRR with no MRGs. Brisk peripheral pulses without edema.  Abdomen: Soft, + BS.  Non tender, no guarding, rebound, hernias, masses. Lymphatics: Non tender without lymphadenopathy.  Musculoskeletal: Full ROM, 5/5 strength, normal gait.  Skin: Warm, dry without rashes, lesions, ecchymosis.  Neuro: Cranial nerves intact. Normal muscle tone, no cerebellar symptoms. Sensation intact.  Psych: Awake and oriented X 3, normal affect, Insight and Judgment appropriate.     Raynelle Dick, NP 1:15 PM [facility]

## 2023-11-09 ENCOUNTER — Ambulatory Visit (HOSPITAL_COMMUNITY)
Admission: RE | Admit: 2023-11-09 | Discharge: 2023-11-09 | Disposition: A | Discharge status: Home / Self Care | Payer: BC Managed Care – PPO | Source: Ambulatory Visit | Attending: Internal Medicine | Admitting: Internal Medicine

## 2023-11-09 ENCOUNTER — Ambulatory Visit (INDEPENDENT_AMBULATORY_CARE_PROVIDER_SITE_OTHER): Payer: BC Managed Care – PPO | Admitting: Nurse Practitioner

## 2023-11-09 ENCOUNTER — Telehealth: Payer: Self-pay | Admitting: Nurse Practitioner

## 2023-11-09 ENCOUNTER — Encounter: Payer: Self-pay | Admitting: Nurse Practitioner

## 2023-11-09 VITALS — BP 112/80 | HR 73 | Temp 97.2°F | Ht 74.0 in | Wt 279.2 lb

## 2023-11-09 DIAGNOSIS — R600 Localized edema: Secondary | ICD-10-CM

## 2023-11-09 DIAGNOSIS — M7989 Other specified soft tissue disorders: Secondary | ICD-10-CM | POA: Diagnosis not present

## 2023-11-09 DIAGNOSIS — G629 Polyneuropathy, unspecified: Secondary | ICD-10-CM | POA: Diagnosis not present

## 2023-11-09 DIAGNOSIS — M79661 Pain in right lower leg: Secondary | ICD-10-CM | POA: Diagnosis not present

## 2023-11-09 DIAGNOSIS — I10 Essential (primary) hypertension: Secondary | ICD-10-CM | POA: Diagnosis not present

## 2023-11-09 MED ORDER — PREDNISONE 20 MG PO TABS: ORAL_TABLET | ORAL | 0 refills | Status: AC

## 2023-11-09 MED ORDER — GABAPENTIN 100 MG PO CAPS: 100.0000 mg | ORAL_CAPSULE | Freq: Three times a day (TID) | ORAL | 2 refills | Status: DC

## 2023-11-09 NOTE — Telephone Encounter (Signed)
Ultrasound was negative. Patient also said you were going to send in a new medication. Pharm-  [id number] [place], [place] - [address]

## 2023-11-09 NOTE — Addendum Note (Signed)
Addended by: Anda Kraft E on: 11/09/2023 09:53 AM   Modules accepted: Orders

## 2023-11-09 NOTE — Patient Instructions (Addendum)
Start prednisone taper and monitor  Start gabapentin 100 mg 1- 3 times a day for neuropathy symptoms of feet.  Can cause drowsiness so start at bedtime only  Gout  Gout is painful swelling of your joints. Gout is a type of arthritis. It is caused by having too much uric acid in your body. Uric acid is a chemical that is made when your body breaks down substances called purines. If your body has too much uric acid, sharp crystals can form and build up in your joints. This causes pain and swelling. Gout attacks can happen quickly and be very painful (acute gout). Over time, the attacks can affect more joints and happen more often (chronic gout). What are the causes? Gout is caused by too much uric acid in your blood. This can happen because: Your kidneys do not remove enough uric acid from your blood. Your body makes too much uric acid. You eat too many foods that are high in purines. These foods include organ meats, some seafood, and beer. Trauma or stress can bring on an attack. What increases the risk? Having a family history of gout. Being male and middle-aged. Being male and having gone through menopause. Having an organ transplant. Taking certain medicines. Having certain conditions, such as: Being very overweight (obese). Lead poisoning. Kidney disease. A skin condition called psoriasis. Other risks include: Losing weight too quickly. Not having enough water in the body (being dehydrated). Drinking alcohol, especially beer. Drinking beverages that are sweetened with a type of sugar called fructose. What are the signs or symptoms? An attack of acute gout often starts at night and usually happens in just one joint. The most common place is the big toe. Other joints that may be affected include joints of the feet, ankle, knee, fingers, wrist, or elbow. Symptoms may include: Very bad pain. Warmth. Swelling. Stiffness. Tenderness. The affected joint may be very painful to  touch. Shiny, red, or purple skin. Chills and fever. Chronic gout may cause symptoms more often. More joints may be involved. You may also have white or yellow lumps (tophi) on your hands or feet or in other areas near your joints. How is this treated? Treatment for an acute attack may include medicines for pain and swelling, such as: NSAIDs, such as ibuprofen. Steroids taken by mouth or injected into a joint. Colchicine. This can be given by mouth or through an IV tube. Treatment to prevent future attacks may include: Taking small doses of NSAIDs or colchicine daily. Using a medicine that reduces uric acid levels in your blood, such as allopurinol. Making changes to your diet. You may need to see a food expert (dietitian) about what to eat and drink to prevent gout. Follow these instructions at home: During a gout attack  If told, put ice on the painful area. To do this: Put ice in a plastic bag. Place a towel between your skin and the bag. Leave the ice on for 20 minutes, 2-3 times a day. Take off the ice if your skin turns bright red. This is very important. If you cannot feel pain, heat, or cold, you have a greater risk of damage to the area. Raise the painful joint above the level of your heart as often as you can. Rest the joint as much as possible. If the joint is in your leg, you may be given crutches. Follow instructions from your doctor about what you cannot eat or drink. Avoiding future gout attacks Eat a low-purine diet. Avoid  foods and drinks such as: Liver. Kidney. Anchovies. Asparagus. Herring. Mushrooms. Mussels. Beer. Stay at a healthy weight. If you want to lose weight, talk with your doctor. Do not lose weight too fast. Start or continue an exercise plan as told by your doctor. Eating and drinking Avoid drinks sweetened by fructose. Drink enough fluids to keep your pee (urine) pale yellow. If you drink alcohol: Limit how much you have to: 0-1 drink a day  for women who are not pregnant. 0-2 drinks a day for men. Know how much alcohol is in a drink. In the [place] one drink equals one 12 oz bottle of beer (355 mL), one 5 oz glass of wine (148 mL), or one 1 oz glass of hard liquor (44 mL). General instructions Take over-the-counter and prescription medicines only as told by your doctor. Ask your doctor if you should avoid driving or using machines while you are taking your medicine. Return to your normal activities when your doctor says that it is safe. Keep all follow-up visits. Where to find more information [employer]: www.niams.http://www.myers.net/ Contact a doctor if: You have another gout attack. You still have symptoms of a gout attack after 10 days of treatment. You have problems (side effects) because of your medicines. You have chills or a fever. You have burning pain when you pee (urinate). You have pain in your lower back or belly. Get help right away if: You have very bad pain. Your pain cannot be controlled. You cannot pee. Summary Gout is painful swelling of the joints. The most common site of pain is the big toe, but it can affect other joints. Medicines and avoiding some foods can help to prevent and treat gout attacks. This information is not intended to replace advice given to you by your health care provider. Make sure you discuss any questions you have with your health care provider. Document Revised: 09/03/2021 Document Reviewed: 09/03/2021 Elsevier Patient Education  2024 [employer].

## 2023-11-09 NOTE — Addendum Note (Signed)
Addended by: Anda Kraft E on: 11/09/2023 11:21 AM   Modules accepted: Orders

## 2023-11-10 ENCOUNTER — Other Ambulatory Visit: Payer: Self-pay | Admitting: Nurse Practitioner

## 2023-11-10 DIAGNOSIS — R600 Localized edema: Secondary | ICD-10-CM

## 2023-11-10 LAB — COMPLETE METABOLIC PANEL WITH GFR
AG Ratio: 1.5 (calc) (ref 1.0–2.5)
ALT: 31 U/L (ref 9–46)
AST: 23 U/L (ref 10–35)
Albumin: 4.4 g/dL (ref 3.6–5.1)
Alkaline phosphatase (APISO): 122 U/L (ref 35–144)
BUN: 16 mg/dL (ref 7–25)
CO2: 29 mmol/L (ref 20–32)
Calcium: 9.7 mg/dL (ref 8.6–10.3)
Chloride: 100 mmol/L (ref 98–110)
Creat: 1.23 mg/dL (ref 0.70–1.35)
Globulin: 2.9 g/dL (ref 1.9–3.7)
Glucose, Bld: 93 mg/dL (ref 65–99)
Potassium: 4.8 mmol/L (ref 3.5–5.3)
Sodium: 141 mmol/L (ref 135–146)
Total Bilirubin: 0.5 mg/dL (ref 0.2–1.2)
Total Protein: 7.3 g/dL (ref 6.1–8.1)
eGFR: 66 mL/min/{1.73_m2} (ref >=60)

## 2023-11-10 LAB — CBC WITH DIFFERENTIAL/PLATELET
Absolute Lymphocytes: 2300 {cells}/uL (ref 850–3900)
Absolute Monocytes: 713 {cells}/uL (ref 200–950)
Basophils Absolute: 35 {cells}/uL (ref 0–200)
Basophils Relative: 0.3 %
Eosinophils Absolute: 138 {cells}/uL (ref 15–500)
Eosinophils Relative: 1.2 %
HCT: 39.1 % (ref 38.5–50.0)
Hemoglobin: 12.7 g/dL — ABNORMAL LOW (ref 13.2–17.1)
MCH: 28.9 pg (ref 27.0–33.0)
MCHC: 32.5 g/dL (ref 32.0–36.0)
MCV: 88.9 fL (ref 80.0–100.0)
MPV: 10.5 fL (ref 7.5–12.5)
Monocytes Relative: 6.2 %
Neutro Abs: 8315 {cells}/uL — ABNORMAL HIGH (ref 1500–7800)
Neutrophils Relative %: 72.3 %
Platelets: 363 10*3/uL (ref 140–400)
RBC: 4.4 10*6/uL (ref 4.20–5.80)
RDW: 12.5 % (ref 11.0–15.0)
Total Lymphocyte: 20 %
WBC: 11.5 10*3/uL — ABNORMAL HIGH (ref 3.8–10.8)

## 2023-11-10 LAB — URIC ACID: Uric Acid, Serum: 7.9 mg/dL (ref 4.0–8.0)

## 2023-11-10 MED ORDER — CEPHALEXIN 500 MG PO CAPS: 500.0000 mg | ORAL_CAPSULE | Freq: Two times a day (BID) | ORAL | 0 refills | Status: AC

## 2023-11-23 DIAGNOSIS — D3131 Benign neoplasm of right choroid: Secondary | ICD-10-CM | POA: Diagnosis not present

## 2023-11-23 DIAGNOSIS — H18593 Other hereditary corneal dystrophies, bilateral: Secondary | ICD-10-CM | POA: Diagnosis not present

## 2023-11-23 DIAGNOSIS — H2513 Age-related nuclear cataract, bilateral: Secondary | ICD-10-CM | POA: Diagnosis not present

## 2023-11-23 DIAGNOSIS — H5213 Myopia, bilateral: Secondary | ICD-10-CM | POA: Diagnosis not present

## 2023-11-23 DIAGNOSIS — H524 Presbyopia: Secondary | ICD-10-CM | POA: Diagnosis not present

## 2023-11-23 DIAGNOSIS — H52223 Regular astigmatism, bilateral: Secondary | ICD-10-CM | POA: Diagnosis not present

## 2023-11-28 ENCOUNTER — Other Ambulatory Visit: Payer: Self-pay | Admitting: Internal Medicine

## 2023-11-28 DIAGNOSIS — I10 Essential (primary) hypertension: Secondary | ICD-10-CM

## 2023-11-29 ENCOUNTER — Encounter: Payer: Self-pay | Admitting: Internal Medicine

## 2023-11-29 NOTE — Patient Instructions (Signed)

## 2023-11-29 NOTE — Progress Notes (Unsigned)
[place]      [facility]ULT   &   [facility]      INTERNAL MEDICINE  Lucky Cowboy, M.D.          Rance Muir, ANP        Adela Glimpse, FNP  [facility] [address]  [place], [place]. [address] Telephone [phone] Telefax [phone]     Future Appointments  Date Time Provider Department  11/30/2023                   6 mo ov 11:00 AM Lucky Cowboy, MD [facility]  02/28/2024                    9 mo ov 11:30 AM Adela Glimpse, NP [facility]  06/08/2024                    cpe  3:00 PM Lucky Cowboy, MD [facility]   History of Present Illness:       This very nice 62 y.o. MWM with HTN, HLD, Pre-Diabetes and Vitamin D Deficiency presents for 6  month follow up.  Patient has GERD controlled on his Famotidine.         Patient is treated for HTN  since Jan 2017 & BP has been controlled at home. Today's BP is at goal - 114/68  . Patient has had no complaints of any cardiac type chest pain, palpitations, dyspnea Pollyann Kennedy /PND, dizziness, claudication or dependent edema.        Hyperlipidemia is not controlled with diet &  Rosuvastatin was added. Patient denies myalgias or other med SE's. Last Lipids were at goal except elevated Trig's :  Lab Results  Component Value Date   CHOL 146 08/31/2023   HDL 44 08/31/2023   LDLCALC 69 08/31/2023   TRIG 235 (H) 08/31/2023   CHOLHDL 3.3 08/31/2023     Also, the patient has history of PreDiabetes (A1c 5.8% /2017) and has had no symptoms of reactive hypoglycemia, diabetic polys, paresthesias or visual blurring.  Last A1c was near  goal:  Lab Results  Component Value Date   HGBA1C 5.9 (H) 08/31/2023                                                      Further, the patient also has history of Vitamin D Deficiency ("26" /2017) and supplements vitamin D without any suspected side-effects. Last vitamin D was still low:  Lab Results  Component Value Date   VD25OH 63 05/31/2023       Current  Outpatient Medications  Medication Instructions   acetaminophen  500 mg,   Oral, Every 6 hours PRN   bisoprolol-hctz 10-6.25 MG tablet Take 1 tablet every Night    VITAMIN D   5,000 Units 2 times daily   famotidine  40 MG tablet TAKE 1 TABLET AT BEDTIME   gabapentin 100 mg 3 times daily   Ibuprofen  prn   Multiple Vitamin  Take 1 tablet  daily.   olmesartan 40 MG tablet Take  1/2 to 1 tablet  Daily    phentermine  37.5 MG tablet Take 1/2 to 1 tablet every Morning    rosuvastatin 10 mg Daily   Zinc 50 mg Daily    No Known Allergies   PMHx:  Past Medical History:  Diagnosis Date   Diverticulosis of colon    mild  02/06/2011   GERD (gastroesophageal reflux disease)    Hypertension    Varicose veins      Immunization History  Administered Date(s) Administered   Influenza, Quadrivalent 10/28/2019   Influenza,inj,Quad 10/01/2018, 11/23/2020   Influenza 09/26/2016, 10/14/2017   PFIZER SARS-COV-2 Vacc 02/29/2020, 03/21/2020, 10/26/2020   PPD Test 04/12/2019, 05/22/2020, 05/27/2021   Tdap 12/31/2015     Past Surgical History:  Procedure Laterality Date   BASAL CELL CARCINOMA EXCISION     removed from back,shoulder blade, and lower back   CHOLECYSTECTOMY     COLONOSCOPY     PILONIDAL CYST EXCISION     VARICOSE VEIN SURGERY     right leg    FHx:    Reviewed / unchanged  SHx:    Reviewed / unchanged   Systems Review:  Constitutional: Denies fever, chills, wt changes, headaches, insomnia, fatigue, night sweats, change in appetite. Eyes: Denies redness, blurred vision, diplopia, discharge, itchy, watery eyes.  ENT: Denies discharge, congestion, post nasal drip, epistaxis, sore throat, earache, hearing loss, dental pain, tinnitus, vertigo, sinus pain, snoring.  CV: Denies chest pain, palpitations, irregular heartbeat, syncope, dyspnea, diaphoresis, orthopnea, PND, claudication or edema. Respiratory: denies cough, dyspnea, DOE, pleurisy, hoarseness, laryngitis, wheezing.   Gastrointestinal: Denies dysphagia, odynophagia, heartburn, reflux, water brash, abdominal pain or cramps, nausea, vomiting, bloating, diarrhea, constipation, hematemesis, melena, hematochezia  or hemorrhoids. Genitourinary: Denies dysuria, frequency, urgency, nocturia, hesitancy, discharge, hematuria or flank pain. Musculoskeletal: Denies arthralgias, myalgias, stiffness, jt. swelling, pain, limping or strain/sprain.  Skin: Denies pruritus, rash, hives, warts, acne, eczema or change in skin lesion(s). Neuro: No weakness, tremor, incoordination, spasms, paresthesia or pain. Psychiatric: Denies confusion, memory loss or sensory loss. Endo: Denies change in weight, skin or hair change.  Heme/Lymph: No excessive bleeding, bruising or enlarged lymph nodes.  Physical Exam  BP 114/68   Pulse 78   Temp 97.9 F (36.6 C)   Resp 16   Ht 6\' 2"  (1.88 m)   Wt 278 lb (126.1 kg)   SpO2 98%   BMI 35.69 kg/m   Appears  well nourished, well groomed  and in no distress.  Eyes: PERRLA, EOMs, conjunctiva no swelling or erythema. Sinuses: No frontal/maxillary tenderness ENT/Mouth: EAC's clear, TM's nl w/o erythema, bulging. Nares clear w/o erythema, swelling, exudates. Oropharynx clear without erythema or exudates. Oral hygiene is good. Tongue normal, non obstructing. Hearing intact.  Neck: Supple. Thyroid not palpable. Car 2+/2+ without bruits, nodes or JVD. Chest: Respirations nl with BS clear & equal w/o rales, rhonchi, wheezing or stridor.  Cor: Heart sounds normal w/ regular rate and rhythm without sig. murmurs, gallops, clicks or rubs. Peripheral pulses normal and equal  without edema.  Abdomen: Soft & bowel sounds normal. Non-tender w/o guarding, rebound, hernias, masses or organomegaly.  Lymphatics: Unremarkable.  Musculoskeletal: Full ROM all peripheral extremities, joint stability, 5/5 strength and normal gait.  Skin: Warm, dry without exposed rashes, lesions or ecchymosis apparent.  Neuro:  Cranial nerves intact, reflexes equal bilaterally. Sensory-motor testing grossly intact. Tendon reflexes grossly intact.  Pysch: Alert & oriented x 3.  Insight and judgement nl & appropriate. No ideations.  Assessment and Plan:   1. Essential hypertension   - Continue medication, monitor blood pressure at home.  - Continue DASH diet.  Reminder to go to the ER if any CP,  SOB, nausea, dizziness, severe HA, changes vision/speech.   - CBC with Differential/Platelet -  COMPLETE METABOLIC PANEL WITH GFR - Magnesium - TSH   2. Hyperlipidemia, mixed  - Continue diet/meds, exercise,& lifestyle modifications.  - Continue monitor periodic cholesterol/liver & renal functions     - Lipid panel - TSH   3. Abnormal glucose  - Continue diet, exercise  - Lifestyle modifications.  - Monitor appropriate labs    - Hemoglobin A1c - Insulin, random   4. Vitamin D deficiency  - Continue supplementation.   - VITAMIN D 25 Hydroxy    5. Gastroesophageal reflux disease   - CBC with Differential/Platelet   6. Medication management  - CBC with Differential/Platelet - COMPLETE METABOLIC PANEL WITH GFR - Magnesium - Lipid panel - TSH - Hemoglobin A1c - Insulin, random - VITAMIN D 25 Hydroxy           Discussed  regular exercise, BP monitoring, weight control to achieve/maintain BMI less than 25 and discussed med and SE's. Recommended labs to assess and monitor clinical status with further disposition pending results of labs.  I discussed the assessment and treatment plan with the patient. The patient was provided an opportunity to ask questions and all were answered. The patient agreed with the plan and demonstrated an understanding of the instructions.  I provided over 30 minutes of exam, counseling, chart review and  complex critical decision making.        The patient was advised to call back or seek an in-person evaluation if the symptoms worsen or if the condition fails to  improve as anticipated.   Marinus Maw, MD

## 2023-11-30 ENCOUNTER — Ambulatory Visit (INDEPENDENT_AMBULATORY_CARE_PROVIDER_SITE_OTHER): Payer: BC Managed Care – PPO | Admitting: Internal Medicine

## 2023-11-30 VITALS — BP 114/68 | HR 78 | Temp 97.9°F | Resp 16 | Ht 74.0 in | Wt 278.0 lb

## 2023-11-30 DIAGNOSIS — E559 Vitamin D deficiency, unspecified: Secondary | ICD-10-CM

## 2023-11-30 DIAGNOSIS — I10 Essential (primary) hypertension: Secondary | ICD-10-CM | POA: Diagnosis not present

## 2023-11-30 DIAGNOSIS — E662 Morbid (severe) obesity with alveolar hypoventilation: Secondary | ICD-10-CM

## 2023-11-30 DIAGNOSIS — R7309 Other abnormal glucose: Secondary | ICD-10-CM

## 2023-11-30 DIAGNOSIS — Z79899 Other long term (current) drug therapy: Secondary | ICD-10-CM | POA: Diagnosis not present

## 2023-11-30 DIAGNOSIS — M722 Plantar fascial fibromatosis: Secondary | ICD-10-CM

## 2023-11-30 DIAGNOSIS — E782 Mixed hyperlipidemia: Secondary | ICD-10-CM | POA: Diagnosis not present

## 2023-11-30 DIAGNOSIS — Z6837 Body mass index (BMI) 37.0-37.9, adult: Secondary | ICD-10-CM

## 2023-11-30 DIAGNOSIS — E66812 Obesity, class 2: Secondary | ICD-10-CM

## 2023-11-30 DIAGNOSIS — K219 Gastro-esophageal reflux disease without esophagitis: Secondary | ICD-10-CM

## 2023-11-30 MED ORDER — DEXAMETHASONE 4 MG PO TABS: ORAL_TABLET | ORAL | 0 refills | Status: DC

## 2023-11-30 MED ORDER — NIFEDIPINE ER OSMOTIC RELEASE 30 MG PO TB24: ORAL_TABLET | ORAL | 3 refills | Status: DC

## 2023-11-30 MED ORDER — MELOXICAM 15 MG PO TABS: ORAL_TABLET | ORAL | 3 refills | Status: DC

## 2023-11-30 MED ORDER — PHENTERMINE HCL 37.5 MG PO TABS: ORAL_TABLET | ORAL | 0 refills | Status: DC

## 2023-12-01 ENCOUNTER — Encounter: Payer: Self-pay | Admitting: Internal Medicine

## 2023-12-01 LAB — COMPLETE METABOLIC PANEL WITH GFR
AG Ratio: 1.5 (calc) (ref 1.0–2.5)
ALT: 33 U/L (ref 9–46)
AST: 19 U/L (ref 10–35)
Albumin: 4.3 g/dL (ref 3.6–5.1)
Alkaline phosphatase (APISO): 111 U/L (ref 35–144)
BUN: 21 mg/dL (ref 7–25)
CO2: 30 mmol/L (ref 20–32)
Calcium: 9.8 mg/dL (ref 8.6–10.3)
Chloride: 100 mmol/L (ref 98–110)
Creat: 1.14 mg/dL (ref 0.70–1.35)
Globulin: 2.9 g/dL (ref 1.9–3.7)
Glucose, Bld: 99 mg/dL (ref 65–99)
Potassium: 4.8 mmol/L (ref 3.5–5.3)
Sodium: 140 mmol/L (ref 135–146)
Total Bilirubin: 0.5 mg/dL (ref 0.2–1.2)
Total Protein: 7.2 g/dL (ref 6.1–8.1)
eGFR: 72 mL/min/{1.73_m2} (ref >=60)

## 2023-12-01 LAB — CBC WITH DIFFERENTIAL/PLATELET
Absolute Lymphocytes: 2737 {cells}/uL (ref 850–3900)
Absolute Monocytes: 677 {cells}/uL (ref 200–950)
Basophils Absolute: 30 {cells}/uL (ref 0–200)
Basophils Relative: 0.3 %
Eosinophils Absolute: 182 {cells}/uL (ref 15–500)
Eosinophils Relative: 1.8 %
HCT: 41.1 % (ref 38.5–50.0)
Hemoglobin: 13.3 g/dL (ref 13.2–17.1)
MCH: 29.3 pg (ref 27.0–33.0)
MCHC: 32.4 g/dL (ref 32.0–36.0)
MCV: 90.5 fL (ref 80.0–100.0)
MPV: 10.9 fL (ref 7.5–12.5)
Monocytes Relative: 6.7 %
Neutro Abs: 6474 {cells}/uL (ref 1500–7800)
Neutrophils Relative %: 64.1 %
Platelets: 317 10*3/uL (ref 140–400)
RBC: 4.54 10*6/uL (ref 4.20–5.80)
RDW: 13.3 % (ref 11.0–15.0)
Total Lymphocyte: 27.1 %
WBC: 10.1 10*3/uL (ref 3.8–10.8)

## 2023-12-01 LAB — MAGNESIUM: Magnesium: 2.3 mg/dL (ref 1.5–2.5)

## 2023-12-01 LAB — LIPID PANEL
Cholesterol: 169 mg/dL (ref <200)
HDL: 44 mg/dL (ref >=40)
LDL Cholesterol (Calc): 93 mg/dL
Non-HDL Cholesterol (Calc): 125 mg/dL (ref <130)
Total CHOL/HDL Ratio: 3.8 (calc) (ref <5.0)
Triglycerides: 234 mg/dL — ABNORMAL HIGH (ref <150)

## 2023-12-01 LAB — HEMOGLOBIN A1C
Hgb A1c MFr Bld: 5.8 %{Hb} — ABNORMAL HIGH (ref <5.7)
Mean Plasma Glucose: 120 mg/dL
eAG (mmol/L): 6.6 mmol/L

## 2023-12-01 LAB — VITAMIN D 25 HYDROXY (VIT D DEFICIENCY, FRACTURES): Vit D, 25-Hydroxy: 51 ng/mL (ref 30–100)

## 2023-12-01 LAB — TSH: TSH: 2.18 m[IU]/L (ref 0.40–4.50)

## 2023-12-01 LAB — INSULIN, RANDOM: Insulin: 20.1 u[IU]/mL — ABNORMAL HIGH

## 2023-12-01 NOTE — Progress Notes (Signed)
[] [] [] [] [] [] [] [] [] [] [] [] [] [] [] [] [] [] [] [] [] [] [] [] [] [] [] [] [] [] [] [] [] [] [] [] [] [] [] [] [] ][] [] [] [] [] [] [] [] [] [] [] [] [] [] [] [] [] [] [] [] [] [] [[] [] [] [] []  [] [] [] [] [] [] [] [] [] [] [] [] [] [] [] [] [] [] [] [] [] [] [] [] [] [] [] [] [] [] [] [] [] [] [] [] [] [] [] [] [] ][] [] [] [] [] [] [] [] [] [] [] [] [] [] [] [] [] [] [] [] [] [] [[] [] [] [] []  -Test results slightly outside the reference range are not unusual. If there is anything important, I will review this with you,  otherwise it is considered normal test values.  If you have further questions,  please do not hesitate to contact me at the office or via [employer].  [] [] [] [] [] [] [] [] [] [] [] [] [] [] [] [] [] [] [] [] [] [] [] [] [] [] [] [] [] [] [] [] [] [] [] [] [] [] [] [] [] ][] [] [] [] [] [] [] [] [] [] [] [] [] [] [] [] [] [] [] [] [] [] [[] [] [] [] []  [] [] [] [] [] [] [] [] [] [] [] [] [] [] [] [] [] [] [] [] [] [] [] [] [] [] [] [] [] [] [] [] [] [] [] [] [] [] [] [] [] ][] [] [] [] [] [] [] [] [] [] [] [] [] [] [] [] [] [] [] [] [] [] [[] [] [] [] []   -  Chol = 169 and LDL = 93   - Both   Excellent   - Very low risk for Heart Attack  / Stroke [] [] [] [] [] [] [] [] [] [] [] [] [] [] [] [] [] [] [] [] [] [] [] [] [] [] [] [] [] [] [] [] [] [] [] [] [] [] [] [] [] ][] [] [] [] [] [] [] [] [] [] [] [] [] [] [] [] [] [] [] [] [] [] [[] [] [] [] []   - A1c = 5.8%  Blood sugar and A1c are elevated in the borderline and                                                           early or pre-diabetes range which has the same   300% increased risk for heart attack, stroke, cancer and                                              alzheimer- type vascular dementia as full blown diabetes.   But the good news is that diet, exercise with                                                       weight loss can cure the early diabetes at this point. [] [] [] [] [] [] [] [] [] [] [] [] [] [] [] [] [] [] [] [] [] [] [] [] [] [] [] [] [] [] [] [] [] [] [] [] [] [] [] [] [] ][] [] [] [] [] [] [] [] [] [] [] [] [] [] [] [] [] [] [] [] [] [] [[] [] [] [] []    -  It is very important that you work harder with diet by                                 avoiding all foods that are white except chicken, fish & calliflower.  - Avoid white rice  (brown & wild rice is OK),   -  Avoid white potatoes  (sweet potatoes in moderation is OK),   White bread or wheat bread or anything made out of   white flour like bagels, donuts, rolls, buns, biscuits, cakes,  - pastries, cookies, pizza crust, and pasta (made from  white flour & egg whites)   - vegetarian pasta or spinach or wheat pasta is OK.  - Multigrain breads like Arnold's, Pepperidge Farm or   multigrain sandwich thins or high fiber breads like   Eureka bread or "Dave's Killer" breads that are                                                                             4 to 5 grams fiber per slice !  are best.    Diet, exercise and weight loss can reverse and cure diabetes in the early stages.   [] [] [] [] [] [] [] [] [] [] [] [] [] [] [] [] [] [] [] [] [] [] [] [] [] [] [] [] [] [] [] [] [] [] [] [] [] [] [] [] [] ][] [] [] [] [] [] [] [] [] [] [] [] [] [] [] [] [] [] [] [] [] [] [[] [] [] [] []   - Vitamin D = 51 - slightly low   - Vitamin D goal is between 70-100.   - Please  make sure that you are taking your Vitamin D as directed.   - It is very important as a natural anti-inflammatory and helping the                          immune system protect against viral infections, like Flu  & the Covid    - Also helps hair, skin, and nails, as well as reducing stroke and heart attack risk.   - It helps your bones  &  and helps with mood.  - It also decreases numerous cancer risks, so please                                                                                           take it as directed.   - Low Vit D is associated with a 200-300% higher risk for CANCER   and 200-300% higher risk for HEART   ATTACK  &  STROKE.    - It is also associated with higher death rate at younger ages,   autoimmune diseases like Rheumatoid arthritis, Lupus, Multiple Sclerosis.     - Also many other serious conditions, like depression, Alzheimer's Dementia                                                                             muscle aches, fatigue, fibromyalgia   [] [] [] [] [] [] [] [] [] [] [] [] [] [] [] [] [] [] [] [] [] [] [] [] [] [] [] [] [] [] [] [] [] [] [] [] [] [] [] [] [] ][] [] [] [] [] [] [] [] [] [] [] [] [] [] [] [] [] [] [] [] [] [] [[] [] [] [] []   -  All Else - CBC - Kidneys - Electrolytes - Liver - Magnesium & Thyroid    - all  Normal / OK [] [] [] [] [] [] [] [] [] [] [] [] [] [] [] [] [] [] [] [] [] [] [] [] [] [] [] [] [] [] [] [] [] [] [] [] [] [] [] [] [] ][] [] [] [] [] [] [] [] [] [] [] [] [] [] [] [] [] [] [] [] [] [] [[] [] [] [] []

## 2023-12-02 ENCOUNTER — Encounter: Payer: Self-pay | Admitting: Internal Medicine

## 2023-12-03 ENCOUNTER — Other Ambulatory Visit: Payer: Self-pay | Admitting: Internal Medicine

## 2023-12-03 DIAGNOSIS — N401 Enlarged prostate with lower urinary tract symptoms: Secondary | ICD-10-CM

## 2023-12-03 MED ORDER — TAMSULOSIN HCL 0.4 MG PO CAPS: ORAL_CAPSULE | ORAL | 3 refills | Status: DC

## 2024-01-10 ENCOUNTER — Other Ambulatory Visit: Payer: Self-pay | Admitting: Internal Medicine

## 2024-01-24 ENCOUNTER — Encounter: Payer: Self-pay | Admitting: Internal Medicine

## 2024-02-17 ENCOUNTER — Encounter: Payer: Self-pay | Admitting: Internal Medicine

## 2024-02-17 ENCOUNTER — Other Ambulatory Visit: Payer: Self-pay

## 2024-02-17 DIAGNOSIS — G629 Polyneuropathy, unspecified: Secondary | ICD-10-CM

## 2024-02-17 DIAGNOSIS — N401 Enlarged prostate with lower urinary tract symptoms: Secondary | ICD-10-CM

## 2024-02-17 DIAGNOSIS — M722 Plantar fascial fibromatosis: Secondary | ICD-10-CM

## 2024-02-17 DIAGNOSIS — I10 Essential (primary) hypertension: Secondary | ICD-10-CM

## 2024-02-17 DIAGNOSIS — E66812 Obesity, class 2: Secondary | ICD-10-CM

## 2024-02-17 MED ORDER — FAMOTIDINE 40 MG PO TABS: 40.0000 mg | ORAL_TABLET | Freq: Every day | ORAL | 0 refills | Status: AC

## 2024-02-17 MED ORDER — OLMESARTAN MEDOXOMIL 40 MG PO TABS: ORAL_TABLET | ORAL | 0 refills | Status: AC

## 2024-02-17 MED ORDER — TAMSULOSIN HCL 0.4 MG PO CAPS: ORAL_CAPSULE | ORAL | 0 refills | Status: AC

## 2024-02-17 MED ORDER — NIFEDIPINE ER OSMOTIC RELEASE 30 MG PO TB24: ORAL_TABLET | ORAL | 0 refills | Status: AC

## 2024-02-17 MED ORDER — ROSUVASTATIN CALCIUM 10 MG PO TABS: 10.0000 mg | ORAL_TABLET | Freq: Every day | ORAL | 0 refills | Status: AC

## 2024-02-17 MED ORDER — MELOXICAM 15 MG PO TABS: ORAL_TABLET | ORAL | 0 refills | Status: AC

## 2024-02-17 MED ORDER — GABAPENTIN 100 MG PO CAPS: 100.0000 mg | ORAL_CAPSULE | Freq: Three times a day (TID) | ORAL | 0 refills | Status: AC

## 2024-02-17 MED ORDER — DEXAMETHASONE 4 MG PO TABS: ORAL_TABLET | ORAL | 0 refills | Status: AC

## 2024-02-17 MED ORDER — PHENTERMINE HCL 37.5 MG PO TABS
ORAL_TABLET | ORAL | 0 refills | Status: AC
Start: 2024-02-17 — End: ?

## 2024-02-21 ENCOUNTER — Other Ambulatory Visit: Payer: Self-pay | Admitting: Family

## 2024-02-28 ENCOUNTER — Ambulatory Visit: Payer: Self-pay | Admitting: Nurse Practitioner

## 2024-05-25 ENCOUNTER — Other Ambulatory Visit: Payer: Self-pay | Admitting: Family

## 2024-05-25 DIAGNOSIS — N401 Enlarged prostate with lower urinary tract symptoms: Secondary | ICD-10-CM

## 2024-05-25 DIAGNOSIS — I10 Essential (primary) hypertension: Secondary | ICD-10-CM

## 2024-05-26 ENCOUNTER — Other Ambulatory Visit: Payer: Self-pay | Admitting: Family

## 2024-05-26 DIAGNOSIS — M722 Plantar fascial fibromatosis: Secondary | ICD-10-CM

## 2024-06-08 ENCOUNTER — Encounter: Payer: Self-pay | Admitting: Internal Medicine
